# Patient Record
Sex: Male | Born: 1990 | Race: White | Hispanic: No | Marital: Married | Smoking: Never smoker
Health system: Southern US, Community
[De-identification: ages and names within clinical notes are randomized; demographics above are authoritative.]

## PROBLEM LIST (undated history)

## (undated) DIAGNOSIS — R011 Cardiac murmur, unspecified: Secondary | ICD-10-CM

## (undated) DIAGNOSIS — J45909 Unspecified asthma, uncomplicated: Secondary | ICD-10-CM

## (undated) DIAGNOSIS — K219 Gastro-esophageal reflux disease without esophagitis: Secondary | ICD-10-CM

## (undated) HISTORY — DX: Cardiac murmur, unspecified: R01.1

## (undated) HISTORY — PX: TONSILLECTOMY AND ADENOIDECTOMY: SUR1326

## (undated) HISTORY — PX: MOUTH SURGERY: SHX715

## (undated) HISTORY — DX: Unspecified asthma, uncomplicated: J45.909

## (undated) HISTORY — DX: Gastro-esophageal reflux disease without esophagitis: K21.9

---

## 2017-04-16 ENCOUNTER — Emergency Department (HOSPITAL_COMMUNITY)
Admission: EM | Admit: 2017-04-16 | Discharge: 2017-04-17 | Disposition: A | Discharge status: Home / Self Care | Payer: 59 | Attending: Emergency Medicine | Admitting: Emergency Medicine

## 2017-04-16 ENCOUNTER — Encounter (HOSPITAL_COMMUNITY): Payer: Self-pay

## 2017-04-16 DIAGNOSIS — R1084 Generalized abdominal pain: Secondary | ICD-10-CM | POA: Diagnosis not present

## 2017-04-16 LAB — URINALYSIS, ROUTINE W REFLEX MICROSCOPIC
BILIRUBIN URINE: NEGATIVE
Glucose, UA: NEGATIVE mg/dL
Hgb urine dipstick: NEGATIVE
Ketones, ur: NEGATIVE mg/dL
Leukocytes, UA: NEGATIVE
NITRITE: NEGATIVE
PH: 7 (ref 5.0–8.0)
Protein, ur: NEGATIVE mg/dL
SPECIFIC GRAVITY, URINE: 1.01 (ref 1.005–1.030)

## 2017-04-16 LAB — COMPREHENSIVE METABOLIC PANEL
ALBUMIN: 4.5 g/dL (ref 3.5–5.0)
ALT: 70 U/L — ABNORMAL HIGH (ref 17–63)
ANION GAP: 14 (ref 5–15)
AST: 52 U/L — ABNORMAL HIGH (ref 15–41)
Alkaline Phosphatase: 67 U/L (ref 38–126)
BUN: 10 mg/dL (ref 6–20)
CHLORIDE: 99 mmol/L — AB (ref 101–111)
CO2: 23 mmol/L (ref 22–32)
Calcium: 9.6 mg/dL (ref 8.9–10.3)
Creatinine, Ser: 1.11 mg/dL (ref 0.61–1.24)
GFR calc Af Amer: >60 mL/min (ref >60)
GFR calc non Af Amer: >60 mL/min (ref >60)
GLUCOSE: 96 mg/dL (ref 65–99)
POTASSIUM: 4.4 mmol/L (ref 3.5–5.1)
SODIUM: 136 mmol/L (ref 135–145)
TOTAL PROTEIN: 7.7 g/dL (ref 6.5–8.1)
Total Bilirubin: 0.8 mg/dL (ref 0.3–1.2)

## 2017-04-16 LAB — CBC
HEMATOCRIT: 51.4 % (ref 39.0–52.0)
HEMOGLOBIN: 17.7 g/dL — AB (ref 13.0–17.0)
MCH: 31.6 pg (ref 26.0–34.0)
MCHC: 34.4 g/dL (ref 30.0–36.0)
MCV: 91.6 fL (ref 78.0–100.0)
Platelets: 199 10*3/uL (ref 150–400)
RBC: 5.61 MIL/uL (ref 4.22–5.81)
RDW: 12.6 % (ref 11.5–15.5)
WBC: 7.5 10*3/uL (ref 4.0–10.5)

## 2017-04-16 LAB — LIPASE, BLOOD: LIPASE: 33 U/L (ref 11–51)

## 2017-04-16 NOTE — ED Triage Notes (Signed)
Pt states that since Friday he has had lower abd pain, pt also reports constipation and [facility]. Denies dysuria.

## 2017-04-17 ENCOUNTER — Emergency Department (HOSPITAL_COMMUNITY): Payer: 59

## 2017-04-17 NOTE — ED Notes (Signed)
Patient transported to X-ray 

## 2017-04-17 NOTE — Discharge Instructions (Signed)
Your tests are all negative indicating that there is no acute process. You can be discharged home and should follow up with a primary care doctor of your choice (resources provided) if pain continues. Return to the emergency department with any new or concerning symptoms.

## 2017-04-18 NOTE — ED Provider Notes (Signed)
[facility] EMERGENCY DEPARTMENT Provider Note   CSN: [id number] Arrival date & time: 04/16/17  1844     History   Chief Complaint Chief Complaint  Patient presents with  . Abdominal Pain    HPI Derrick Mejia. is a 26 y.o. male.  Patient presents with 5 days of lower abdominal pain that has been intermittent. He had constipation recently, relieved with use of miralax, but same pain continues. It is associated with low back pain. No fever, nausea, vomiting. Better with BM, flatulence. Better with lying down. No aggravating factors. No melena or bloody stools. Reports increased urination but has been drinking more water.  No testicular pain or groin pain. No penile discharge. He states he is worried that he is having these symptoms and is scared of abdominal pain because he just lost his 3-day old son to volvulus.     Abdominal Pain   Pertinent negatives include fever.    History reviewed. No pertinent past medical history.  There are no active problems to display for this patient.   Past Surgical History:  Procedure Laterality Date  . MOUTH SURGERY         Home Medications    Prior to Admission medications   Medication Sig Start Date End Date Taking? Authorizing Provider  acetaminophen (TYLENOL) 325 MG tablet Take 325-650 mg by mouth every 6 (six) hours as needed (for pain or headaches).   Yes [provider]  multivitamin (ONE-A-DAY MEN'S) TABS tablet Take 1 tablet by mouth daily.   Yes [provider]  polyethylene glycol powder (GLYCOLAX/MIRALAX) powder Take 17-25.5 g by mouth daily.    Yes [provider]    Family History No family history on file.  Social History Social History   Tobacco Use  . Smoking status: Never Smoker  . Smokeless tobacco: Never Used  Substance Use Topics  . Alcohol use: Yes  . Drug use: No     Allergies   Sulfa antibiotics; Codone [hydrocodone]; and Percocet  [oxycodone-acetaminophen]   Review of Systems Review of Systems  Constitutional: Negative for chills and fever.  HENT: Negative.   Respiratory: Negative.   Cardiovascular: Negative.   Gastrointestinal: Positive for abdominal pain.  Genitourinary: Negative.   Musculoskeletal: Negative.   Skin: Negative.   Neurological: Negative.      Physical Exam Updated Vital Signs BP 137/79   Pulse 99   Temp 98.1 F (36.7 C) (Oral)   Resp 19   SpO2 100%   Physical Exam  Constitutional: He appears well-developed and well-nourished.  HENT:  Head: Normocephalic.  Neck: Normal range of motion. Neck supple.  Cardiovascular: Normal rate and regular rhythm.  Pulmonary/Chest: Effort normal and breath sounds normal.  Abdominal: Soft. Bowel sounds are normal. There is no tenderness. There is no rebound and no guarding.  Musculoskeletal: Normal range of motion.  Neurological: He is alert. No cranial nerve deficit.  Skin: Skin is warm and dry. No rash noted.  Psychiatric: He has a normal mood and affect.     ED Treatments / Results  Labs (all labs ordered are listed, but only abnormal results are displayed) Labs Reviewed  COMPREHENSIVE METABOLIC PANEL - Abnormal; Notable for the following components:      Result Value   Chloride 99 (*)    AST 52 (*)    ALT 70 (*)    All other components within normal limits  CBC - Abnormal; Notable for the following components:   Hemoglobin 17.7 (*)  All other components within normal limits  LIPASE, BLOOD  URINALYSIS, ROUTINE W REFLEX MICROSCOPIC   Results for orders placed or performed during the hospital encounter of 04/16/17  Lipase, blood  Result Value Ref Range   Lipase 33 11 - 51 U/L  Comprehensive metabolic panel  Result Value Ref Range   Sodium 136 135 - 145 mmol/L   Potassium 4.4 3.5 - 5.1 mmol/L   Chloride 99 (L) 101 - 111 mmol/L   CO2 23 22 - 32 mmol/L   Glucose, Bld 96 65 - 99 mg/dL   BUN 10 6 - 20 mg/dL   Creatinine, Ser 4.091.11  0.61 - 1.24 mg/dL   Calcium 9.6 8.9 - 81.110.3 mg/dL   Total Protein 7.7 6.5 - 8.1 g/dL   Albumin 4.5 3.5 - 5.0 g/dL   AST 52 (H) 15 - 41 U/L   ALT 70 (H) 17 - 63 U/L   Alkaline Phosphatase 67 38 - 126 U/L   Total Bilirubin 0.8 0.3 - 1.2 mg/dL   GFR calc non Af Amer >60 >60 mL/min   GFR calc Af Amer >60 >60 mL/min   Anion gap 14 5 - 15  CBC  Result Value Ref Range   WBC 7.5 4.0 - 10.5 K/uL   RBC 5.61 4.22 - 5.81 MIL/uL   Hemoglobin 17.7 (H) 13.0 - 17.0 g/dL   HCT 91.451.4 78.239.0 - 95.652.0 %   MCV 91.6 78.0 - 100.0 fL   MCH 31.6 26.0 - 34.0 pg   MCHC 34.4 30.0 - 36.0 g/dL   RDW 21.312.6 08.611.5 - 57.815.5 %   Platelets 199 150 - 400 K/uL  Urinalysis, Routine w reflex microscopic  Result Value Ref Range   Color, Urine YELLOW YELLOW   APPearance CLEAR CLEAR   Specific Gravity, Urine 1.010 1.005 - 1.030   pH 7.0 5.0 - 8.0   Glucose, UA NEGATIVE NEGATIVE mg/dL   Hgb urine dipstick NEGATIVE NEGATIVE   Bilirubin Urine NEGATIVE NEGATIVE   Ketones, ur NEGATIVE NEGATIVE mg/dL   Protein, ur NEGATIVE NEGATIVE mg/dL   Nitrite NEGATIVE NEGATIVE   Leukocytes, UA NEGATIVE NEGATIVE    EKG  EKG Interpretation None       Radiology Dg Abdomen Acute W/chest  Result Date: 04/17/2017 CLINICAL DATA:  [age] year old male with abdominal pain. EXAM: DG ABDOMEN ACUTE W/ 1V CHEST COMPARISON:  Lumbar spine radiograph dated 07/31/2012 FINDINGS: The lungs are clear. There is no pleural effusion or pneumothorax. The cardiac silhouette is within normal limits. There is no bowel dilatation or evidence of obstruction. No free air or radiopaque calculi. The osseous structures and soft tissues appear unremarkable. IMPRESSION: Negative abdominal radiographs.  No acute cardiopulmonary disease. Electronically Signed   By: Elgie CollardArash  Radparvar M.D.   On: 04/17/2017 00:56    Procedures Procedures (including critical care time)  Medications Ordered in ED Medications - No data to display   Initial Impression / Assessment and Plan / ED  Course  I have reviewed the triage vital signs and the nursing notes.  Pertinent labs & imaging results that were available during my care of the patient were reviewed by me and considered in my medical decision making (see chart for details).     Patient with abdominal pain, without other symptoms. Exam is essentially benign and lab studies are normal. No cause found for his abdominal pain but he can be discharged home with PCP follow up.  Final Clinical Impressions(s) / ED Diagnoses   Final diagnoses:  Generalized abdominal  pain    ED Discharge Orders    None       Elpidio AnisUpstill, Kindred Reidinger, Cordelia Poche-C 04/18/17 0503    Arby BarrettePfeiffer, Marcy, MD 04/24/17 2322

## 2017-06-16 ENCOUNTER — Encounter: Payer: Self-pay | Admitting: Family Medicine

## 2017-06-16 ENCOUNTER — Ambulatory Visit (INDEPENDENT_AMBULATORY_CARE_PROVIDER_SITE_OTHER): Payer: 59 | Admitting: Family Medicine

## 2017-06-16 VITALS — BP 118/88 | HR 98 | Temp 98.4°F | Ht 67.5 in | Wt 175.6 lb

## 2017-06-16 DIAGNOSIS — Z634 Disappearance and death of family member: Secondary | ICD-10-CM | POA: Diagnosis not present

## 2017-06-16 DIAGNOSIS — M545 Low back pain: Secondary | ICD-10-CM

## 2017-06-16 DIAGNOSIS — G8929 Other chronic pain: Secondary | ICD-10-CM | POA: Diagnosis not present

## 2017-06-16 DIAGNOSIS — R1031 Right lower quadrant pain: Secondary | ICD-10-CM

## 2017-06-16 DIAGNOSIS — F4321 Adjustment disorder with depressed mood: Secondary | ICD-10-CM

## 2017-06-16 DIAGNOSIS — K219 Gastro-esophageal reflux disease without esophagitis: Secondary | ICD-10-CM | POA: Insufficient documentation

## 2017-06-16 DIAGNOSIS — J45909 Unspecified asthma, uncomplicated: Secondary | ICD-10-CM | POA: Insufficient documentation

## 2017-06-16 DIAGNOSIS — R7989 Other specified abnormal findings of blood chemistry: Secondary | ICD-10-CM | POA: Insufficient documentation

## 2017-06-16 DIAGNOSIS — R945 Abnormal results of liver function studies: Secondary | ICD-10-CM

## 2017-06-16 NOTE — Assessment & Plan Note (Signed)
S:Long term low back pain . 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 Perhaps mildly worse lately but states he once again gets hyperfocused on issues.  A/P: some DDD on prior x-rays noted in PACS system. No red flags on exam today outside of duration. Offered referral to Dr. Berline Choughigby- declines for now

## 2017-06-16 NOTE — Assessment & Plan Note (Signed)
Alcohol 3-4 a night. LFTs under 100.  Lab Results  Component Value Date   ALT 70 (H) 04/16/2017   AST 52 (H) 04/16/2017   ALKPHOS 67 04/16/2017   BILITOT 0.8 04/16/2017  advised cutting down to 1-2 a night maximum- follow up in 3 months and repeat LFTs- he declines repeat today

## 2017-06-16 NOTE — Assessment & Plan Note (Signed)
S: lost 3 day old child late 2018- [facility]. Patient states initially thought he was ok- was trying to be strong for wife. In last month or so wife has been better but he states he constantly thinks something is seriously wrong with him but admits he thinks this is stress related A/P: prolonged counseling with patient. We discussed hospice grief counseling as a great choice- they even have groups for folks who have lost a child. He agrees to consider. Also gave handout for  behavioral health

## 2017-06-16 NOTE — Progress Notes (Signed)
Phone: [phone]  Subjective:  Patient presents today to establish care as a new patient with no recent medical provider. Chief complaint-noted.   See problem oriented charting  The following were reviewed and entered/updated in epic: Past Medical History:  Diagnosis Date  . Asthma    childhood- declines need for albuterol as adult  . GERD (gastroesophageal reflux disease)    intermittent prn OTC  . Heart murmur    in childhood only   Patient Active Problem List   Diagnosis Date Noted  . Grief at loss of child 06/16/2017    Priority: High  . Chronic bilateral low back pain 06/16/2017    Priority: Low  . Asthma     Priority: Low  . GERD (gastroesophageal reflux disease)     Priority: Low  . LFT elevation 06/16/2017   Past Surgical History:  Procedure Laterality Date  . MOUTH SURGERY    . TONSILLECTOMY AND ADENOIDECTOMY Bilateral     Family History  Problem Relation Age of Onset  . Asthma Mother   . Hypertension Mother   . Arthritis Mother   . Kidney Stones Father        but NO regular care  . Diverticulitis Brother        half  . Early death Son   . Diverticulitis Maternal Grandmother   . Klinefelter's syndrome Brother        half  . Other Brother        half    Medications- reviewed and updated No current outpatient medications on file.   No current facility-administered medications for this visit.     Allergies-reviewed and updated Allergies  Allergen Reactions  . Sulfa Antibiotics Anaphylaxis  . Codone [Hydrocodone] Rash    Mouth breaks out in sores (if taken)  . Percocet [Oxycodone-Acetaminophen] Rash    Mouth will break out in sores (if taken)    Social History   Social History Narrative   Married in Apr 11, 2015. 1st son died at 30 days old within 3 days of their anniversary.       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  HENT: Negative for hearing loss and tinnitus.   Eyes: Negative for blurred vision and double vision.  Respiratory: Negative for cough and hemoptysis.   Cardiovascular: Negative for chest pain and palpitations.  Gastrointestinal: Positive for abdominal pain (lower) and heartburn. Negative for constipation, diarrhea, nausea and vomiting.  Genitourinary: Positive for frequency. Negative for hematuria.  Musculoskeletal: Positive for back pain and myalgias. Negative for neck pain.  Skin: Negative for itching and rash.  Neurological: Negative for dizziness and headaches.  Endo/Heme/Allergies: Negative for polydipsia. Does not bruise/bleed easily.  Psychiatric/Behavioral: Negative for hallucinations. The patient is nervous/anxious.    Objective: BP 118/88   Pulse 98   Temp 98.4 F (36.9 C) (Oral)   Ht 5' 7.5" (1.715 m)   Wt 175 lb 9.6 oz (79.7 kg)   SpO2 97%   BMI 27.10 kg/m  Gen: NAD, resting comfortably HEENT: Mucous membranes are moist. Oropharynx normal. TM normal. Eyes: sclera and lids normal, PERRLA Neck: no thyromegaly, no cervical lymphadenopathy CV: RRR no murmurs rubs or gallops Lungs: CTAB no crackles, wheeze, rhonchi Abdomen: soft/nontender/nondistended/normal bowel sounds. No rebound or guarding.  Ext: no edema  Skin: warm, dry Neuro: 5/5 strength in upper and lower extremities, normal gait, normal reflexes GU: no obvious hernia (initially thought possibly mild bulge in right groin but later noted to be equal bilaterally), circumcised penis, normal testicular exam and scrotum exam. No lymph node enlarement  Assessment/Plan:  Right groin discomfort S: Patient states since last Wednesday he has had some right groin discomfort. He thought he may have felt lymph node enlarged and is now worried about lymphoma. Also worried about hernia. States he has gone back to work recently and doing much heavier  lifting and also tried some planks recently. Patient has pressed on area a lot and wonders if that is causing his tenderness.   He has also had off and on abdominal pain lower for 2-3 months. Shifts focus each time new symptom comes up. Had UA in hospital reassuring. CBC and CMP also largely reassuring (emergency room visit for abdominal pain) as well as abdominal films. Stated this started after a dose of ex lax.   Stress level high with loss of child late [id number] at 513 days old A/P: Normal testicular and groin exam- no obvious hernia or lymph node enlargement. Also with some lower abdominal pain- largely mild- discussed follow up for this if new or worsening symptoms  Grief at loss of child S: lost 3 day old child late 2018- [facility]. Patient states initially thought he was ok- was trying to be strong for wife. In last month or so wife has been better but he states he constantly thinks something is seriously wrong with him but admits he thinks this is stress related A/P: prolonged counseling with patient. We discussed hospice grief counseling as a great choice- they even have groups for folks who have lost a child. He agrees to consider. Also gave handout for [place] [facility]   Chronic bilateral low back pain S:Long term low back pain . 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 Perhaps mildly worse lately but states he once again gets hyperfocused on issues.  A/P: some DDD on prior x-rays noted in PACS system. No red flags on exam today outside of duration. Offered referral to Dr. Berline Choughigby- declines for now  LFT elevation Alcohol 3-4 a night. LFTs under 100.  Lab Results  Component Value Date   ALT 70 (H) 04/16/2017   AST 52 (H) 04/16/2017   ALKPHOS 67 04/16/2017   BILITOT 0.8 04/16/2017  advised cutting down to 1-2 a night maximum- follow up in 3 months and repeat LFTs- he declines repeat today  Future Appointments  Date Time Provider Department Center   09/15/2017 11:00 AM Shelva MajesticHunter, Stephen O, MD [facility]   Please note- patient was NOT given flu shot or prevnar 13- these will be removed from patient chart  Return precautions advised. Tana ConchStephen Hunter, MD

## 2017-06-16 NOTE — Patient Instructions (Signed)
Exam is very reassuring today- no enlarged lymph nodes  I honestly think you may have a muscle strain either from the increased lifting or the planking  Dr. Berline Choughigby would be happy to see you for your low back pain if you would like - he is skilled with manipulation if appropriate- I can refer you if you want- just let us know  See me in 3 months for a physical- come fasting and we will update labs  Would cut alcohol to 10-14 a week maximum with very slight increase in #s found on liver tests  Please consider the hospice grief counseling- I agree that the stress from what you have had to go through is likely prompting higher focus on your physical symptoms

## 2017-07-07 ENCOUNTER — Telehealth: Payer: Self-pay | Admitting: Family Medicine

## 2017-07-07 NOTE — Telephone Encounter (Signed)
See note

## 2017-07-07 NOTE — Telephone Encounter (Signed)
Copied from CRM [phone]. Topic: Inquiry >> Jul 07, 2017  4:08 PM Stephannie LiSimmons, Tationna Fullard L, NT wrote: Reason for CRM: Patient called and would like a return call from Dr Durene CalHunter he was having stomach issues after using the bathroom he has something similar to cramps .

## 2017-07-07 NOTE — Telephone Encounter (Signed)
Patient is scheduled for tomorrow, July 08, 2017 at 4:15

## 2017-07-08 ENCOUNTER — Encounter: Payer: Self-pay | Admitting: Family Medicine

## 2017-07-08 ENCOUNTER — Ambulatory Visit (INDEPENDENT_AMBULATORY_CARE_PROVIDER_SITE_OTHER): Payer: 59 | Admitting: Family Medicine

## 2017-07-08 VITALS — BP 134/88 | HR 101 | Temp 98.9°F | Ht 67.5 in | Wt 176.8 lb

## 2017-07-08 DIAGNOSIS — R1030 Lower abdominal pain, unspecified: Secondary | ICD-10-CM

## 2017-07-08 LAB — POC HEMOCCULT BLD/STL (OFFICE/1-CARD/DIAGNOSTIC): FECAL OCCULT BLD: NEGATIVE

## 2017-07-08 NOTE — Progress Notes (Signed)
Subjective:  Derrick MinionRichard J Oettinger Jr. is a 27 y.o. year old very pleasant male patient who presents for/with See problem oriented charting ROS- no nausea or vomiting. No unintentional weight loss. Still admits to high stress level.    Past Medical History-  Patient Active Problem List   Diagnosis Date Noted  . Grief at loss of child 06/16/2017    Priority: High  . Chronic bilateral low back pain 06/16/2017    Priority: Low  . Asthma     Priority: Low  . GERD (gastroesophageal reflux disease)     Priority: Low  . LFT elevation 06/16/2017    Medications- reviewed and updated No current outpatient medications on file.   No current facility-administered medications for this visit.     Objective: BP 134/88 (BP Location: Left Arm, Patient Position: Sitting, Cuff Size: Large)   Pulse (!) 101   Temp 98.9 F (37.2 C) (Oral)   Ht 5' 7.5" (1.715 m)   Wt 176 lb 12.8 oz (80.2 kg)   SpO2 96%   BMI 27.28 kg/m  Gen: NAD, resting comfortably CV: RRR no murmurs rubs or gallops. Not tachycardic on my exam Lungs: CTAB no crackles, wheeze, rhonchi Abdomen: soft/nontender on exam/nondistended/normal bowel sounds. No rebound or guarding.  Ext: no edema Skin: warm, dry No lymphadenopathy in the groin. Rectal exam normal without large stool burden.   Assessment/Plan:  Lower abdominal pain - Plan: POC Hemoccult Bld/Stl (1-Cd Office Dx), CANCELED: POCT Occult Blood Stool S: after he has a bowel movement- feels like stomach cramps and some low back pain. Per week, has 3-4 x where it hurts out of the 7 BMs per week. States extremely mild- even hesitates to call current symptoms pain  He started on nexium and has had less bloating and slight decrease in discomfort after bowel movement. Was having epigastric pain and that is much better. Sometimes he has some tailbone pain if sits prolonged period.   Admits main concern is colon cancer. If he has an alcoholic beverage or is busy- doesn't really notice  any of the above symptoms.  A/P: Patient remains highly anxious about his health and potential for high risk conditions like cancer. Stool card today negative for blood and this gave him a fair bit of reassurance. We still did give follow up precautions. I once again discussed calling hospice for grief counseling or getting 1 on 1 therapy through [place] [facility].    Future Appointments  Date Time Provider Department Center  09/15/2017 11:00 AM Shelva MajesticHunter, Filbert Craze O, MD [facility]   Lab/Order associations: Lower abdominal pain - Plan: POC Hemoccult Bld/Stl (1-Cd Office Dx), CANCELED: POCT Occult Blood Stool  Time Stamp The duration of face-to-face time during this visit was greater than 15 minutes. Greater than 50% of this time was spent in counseling, explanation of diagnosis, planning of further management, and/or coordination of care including particularly discussing importance of counseling and also providing reassurance as to low risk for colon cancer. .    Return precautions advised.  Tana ConchStephen Clayten Allcock, MD

## 2017-07-08 NOTE — Patient Instructions (Signed)
Negative stool card today- that is a negative screening for prostate cancer  I still think most of your symptoms are from anxiety- continue to encourage you to seek counseling

## 2017-08-13 ENCOUNTER — Encounter: Payer: Self-pay | Admitting: Family Medicine

## 2017-08-15 ENCOUNTER — Encounter: Payer: Self-pay | Admitting: Family Medicine

## 2017-08-15 ENCOUNTER — Telehealth: Payer: Self-pay

## 2017-08-15 ENCOUNTER — Ambulatory Visit (INDEPENDENT_AMBULATORY_CARE_PROVIDER_SITE_OTHER): Payer: 59 | Admitting: Family Medicine

## 2017-08-15 VITALS — BP 118/88 | HR 99 | Temp 98.4°F | Ht 67.5 in | Wt 171.0 lb

## 2017-08-15 DIAGNOSIS — R1013 Epigastric pain: Secondary | ICD-10-CM | POA: Diagnosis not present

## 2017-08-15 DIAGNOSIS — K219 Gastro-esophageal reflux disease without esophagitis: Secondary | ICD-10-CM | POA: Diagnosis not present

## 2017-08-15 DIAGNOSIS — B370 Candidal stomatitis: Secondary | ICD-10-CM

## 2017-08-15 LAB — COMPREHENSIVE METABOLIC PANEL
ALBUMIN: 4.8 g/dL (ref 3.5–5.2)
ALK PHOS: 66 U/L (ref 39–117)
ALT: 31 U/L (ref 0–53)
AST: 26 U/L (ref 0–37)
BUN: 11 mg/dL (ref 6–23)
CALCIUM: 10.1 mg/dL (ref 8.4–10.5)
CO2: 27 mEq/L (ref 19–32)
CREATININE: 1.1 mg/dL (ref 0.40–1.50)
Chloride: 99 mEq/L (ref 96–112)
GFR: 84.59 mL/min (ref >60.00)
Glucose, Bld: 88 mg/dL (ref 70–99)
POTASSIUM: 4.5 meq/L (ref 3.5–5.1)
Sodium: 135 mEq/L (ref 135–145)
TOTAL PROTEIN: 7.9 g/dL (ref 6.0–8.3)
Total Bilirubin: 1 mg/dL (ref 0.2–1.2)

## 2017-08-15 LAB — LIPASE: LIPASE: 26 U/L (ref 11.0–59.0)

## 2017-08-15 LAB — CBC
HEMATOCRIT: 53.5 % — AB (ref 39.0–52.0)
Hemoglobin: 18.6 g/dL (ref 13.0–17.0)
MCHC: 34.8 g/dL (ref 30.0–36.0)
MCV: 91.6 fl (ref 78.0–100.0)
PLATELETS: 202 10*3/uL (ref 150.0–400.0)
RBC: 5.84 Mil/uL — AB (ref 4.22–5.81)
RDW: 12.9 % (ref 11.5–15.5)
WBC: 5.2 10*3/uL (ref 4.0–10.5)

## 2017-08-15 MED ORDER — NYSTATIN 100000 UNIT/ML MT SUSP: 5.0000 mL | Freq: Four times a day (QID) | OROMUCOSAL | 0 refills | Status: DC

## 2017-08-15 NOTE — Telephone Encounter (Signed)
Spoke with Dr. Durene CalHunter who is aware. Patient's hemoglobin was high last check also.

## 2017-08-15 NOTE — Progress Notes (Signed)
Subjective:  Derrick Mejia. is a 27 y.o. year old very pleasant male patient who presents for/with See problem oriented charting ROS- epigastric burning. No shortness of breath. No edema. andmits to anxiety   Past Medical History-  Patient Active Problem List   Diagnosis Date Noted  . Grief at loss of child 06/16/2017    Priority: High  . Chronic bilateral low back pain 06/16/2017    Priority: Low  . Asthma     Priority: Low  . GERD (gastroesophageal reflux disease)     Priority: Low  . LFT elevation 06/16/2017    Medications- reviewed and updated Current Outpatient Medications  Medication Sig Dispense Refill  . nystatin (MYCOSTATIN) 100000 UNIT/ML suspension Take 5 mLs (500,000 Units total) by mouth 4 (four) times daily. For 7 days. Swish and spit out after 1 minute. 180 mL 0   No current facility-administered medications for this visit.     Objective: BP 118/88 (BP Location: Left Arm, Patient Position: Sitting, Cuff Size: Large)   Pulse 99   Temp 98.4 F (36.9 C) (Oral)   Ht 5' 7.5" (1.715 m)   Wt 171 lb (77.6 kg)   SpO2 98%   BMI 26.39 kg/m  Gen: NAD, resting comfortably CV: RRR no murmurs rubs or gallops Lungs: CTAB no crackles, wheeze, rhonchi Abdomen: soft/nontender/nondistended/normal bowel sounds. No rebound or guarding.  Ext: no edema Skin: warm, dry  Assessment/Plan:  Also on tongue note whitish plaque- will treat with nystatin for 7 days for potential thrush  GERD (gastroesophageal reflux disease) Upper abdominal pain S:  He was seen in late February for very mild lower abdominal pain after bowel movements at least for 50% of bowel movements. Also was having some bloating and epigastric discomfort. Nexium seemed to help all symptoms.   He has had a phoia of medical issues since loss of his infant- which is understandable. We did stool cards to provide some reassurance against colon cancer which was his main concern. We discussed hospice for grief  counseling or [place] [facility] for 1 on 1 therapy. He does not mention the lower abdominal pain today. 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 Went to [employer] last Friday and took ranitidine which helped a fair amount for a few days. He went to [place] and didn't take ranitidine and had significant reflux- then later took one which helped some. Then later did some baking soda which helped some. Yesterday had similar sensation but not as severe as Wednesday. Mylanta Wednesday night didn't help. Some mid back pain as well. Stool has alternated between brown and light yellow. No unintentional weight loss- states has lost from weight from worrying- states barely ate last 2 days anxious about pancreatic cancer. Burping more and some regurgitation.  A/P: very strongly suspect GERD - treat with ranitidine twice a day before meals for next 7 days - if that doesn't work use omeprazole 20mg  over the counter before breakfast for 2 weeks - he will see me back if that doesn't work - will get CBC, CMP, lipase. He is still drinking 3-4 beers a night at times- advised against with his mild LFT elevations. Will trend that today. Also appears mildly dehydrated- may be reason for high hemoglobin. Lipase to reassure him not pancreatic cancer  Future Appointments  Date Time Provider Department Center  09/15/2017 11:00 AM Shelva Majestic, MD [facility]   Lab/Order associations: Epigastric abdominal pain - Plan: CBC, Comprehensive metabolic panel, Lipase  Gastroesophageal reflux disease, esophagitis presence not specified  Meds ordered this encounter  Medications  . nystatin (MYCOSTATIN) 100000 UNIT/ML suspension    Sig: Take 5 mLs (500,000 Units total) by mouth 4 (four) times daily. For 7 days. Swish and spit out after 1 minute.    Dispense:  180 mL    Refill:  0     Return precautions advised.  Tana Conch, MD

## 2017-08-15 NOTE — Assessment & Plan Note (Signed)
Upper abdominal pain S:  He was seen in late February for very mild lower abdominal pain after bowel movements at least for 50% of bowel movements. Also was having some bloating and epigastric discomfort. Nexium seemed to help all symptoms.   He has had a phoia of medical issues since loss of his infant- which is understandable. We did stool cards to provide some reassurance against colon cancer which was his main concern. We discussed hospice for grief counseling or [facility] for 1 on 1 therapy. He does not mention the lower abdominal pain today. 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 Went to [employer] last Friday and took ranitidine which helped a fair amount for a few days. He went to [place] and didn't take ranitidine and had significant reflux- then later took one which helped some. Then later did some baking soda which helped some. Yesterday had similar sensation but not as severe as Wednesday. Mylanta Wednesday night didn't help. Some mid back pain as well. Stool has alternated between brown and light yellow. No unintentional weight loss- states has lost from weight from worrying- states barely ate last 2 days anxious about pancreatic cancer. Burping more and some regurgitation.  A/P: very strongly suspect GERD - treat with ranitidine twice a day before meals for next 7 days - if that doesn't work use omeprazole 20mg  over the counter before breakfast for 2 weeks - he will see me back if that doesn't work - will get CBC, CMP, lipase. He is still drinking 3-4 beers a night at times- advised against with his mild LFT elevations. Will trend that today. Also appears mildly dehydrated- may be reason for high hemoglobin. Lipase to reassure him not pancreatic cancer

## 2017-08-15 NOTE — Patient Instructions (Addendum)
very strongly suspect acid reflux as cause of symptoms - treat with ranitidine twice a day before meals for next 7 days - if that doesn't work use omeprazole 20mg  over the counter before breakfast for 2 weeks - he will see me back if that doesn't work  Also on tongue note whitish discoloration- will treat with nystatin for 7 days for potential thrush  Try to drink at least 60 oz of water a day  Please stop by lab before you go  Looks like we have follow up in a month anyway- so that will be a good time to check in

## 2017-08-15 NOTE — Telephone Encounter (Signed)
[facility] called with critical Hgb on patient. Hgb 18.6.

## 2017-08-16 NOTE — Telephone Encounter (Signed)
See result note.  

## 2017-08-18 ENCOUNTER — Telehealth: Payer: Self-pay

## 2017-08-18 ENCOUNTER — Telehealth: Payer: Self-pay | Admitting: Family Medicine

## 2017-08-18 NOTE — Telephone Encounter (Unsigned)
Copied from CRM [phone]. Topic: Inquiry >> Aug 18, 2017  9:54 AM Raquel SarnaHayes, Teresa G wrote: Pt has been asked to make an appt from labs recently done. Pt wants a call back to to discuss why he is being asked for an appt.  Please call pt back to discuss and schedule appt.

## 2017-08-18 NOTE — Telephone Encounter (Signed)
Called and spoke to patient about his labs result. Patient verbalized understanding. I schedule him a follow up appointment for April 15th to discuss blood work

## 2017-08-18 NOTE — Telephone Encounter (Signed)
Patterson HammersmithAria Dark, CMA called and spoke with patient

## 2017-08-25 ENCOUNTER — Ambulatory Visit (INDEPENDENT_AMBULATORY_CARE_PROVIDER_SITE_OTHER): Payer: 59 | Admitting: Family Medicine

## 2017-08-25 ENCOUNTER — Encounter: Payer: Self-pay | Admitting: Family Medicine

## 2017-08-25 VITALS — BP 122/68 | HR 80 | Temp 98.2°F | Ht 67.5 in | Wt 171.6 lb

## 2017-08-25 DIAGNOSIS — Z114 Encounter for screening for human immunodeficiency virus [HIV]: Secondary | ICD-10-CM | POA: Diagnosis not present

## 2017-08-25 DIAGNOSIS — D751 Secondary polycythemia: Secondary | ICD-10-CM | POA: Insufficient documentation

## 2017-08-25 NOTE — Patient Instructions (Addendum)
Health Maintenance Due  Topic Date Due  . HIV Screening- Today in Office Visit. Obviously very low risk 05/30/2004   Please stop by lab before you go  Some of these labs MAY take longer to get back than usual (such as a few weeks)

## 2017-08-25 NOTE — Progress Notes (Signed)
Subjective:  Derrick MinionRichard J Steveson Jr. is a 27 y.o. year old very pleasant male patient who presents for/with See problem oriented charting ROS- abdominal pain resolved. Rare headaches with no recent increase. No fever, chills, night sweats.    Past Medical History-  Patient Active Problem List   Diagnosis Date Noted  . Grief at loss of child 06/16/2017    Priority: High  . Chronic bilateral low back pain 06/16/2017    Priority: Low  . Asthma     Priority: Low  . GERD (gastroesophageal reflux disease)     Priority: Low  . Polycythemia 08/25/2017  . LFT elevation 06/16/2017    Medications- reviewed and updated Current Outpatient Medications  Medication Sig Dispense Refill  . nystatin (MYCOSTATIN) 100000 UNIT/ML suspension Take 5 mLs (500,000 Units total) by mouth 4 (four) times daily. For 7 days. Swish and spit out after 1 minute. 180 mL 0   No current facility-administered medications for this visit.     Objective: BP 122/68 (BP Location: Left Arm, Patient Position: Sitting, Cuff Size: Normal)   Pulse 80   Temp 98.2 F (36.8 C) (Oral)   Ht 5' 7.5" (1.715 m)   Wt 171 lb 9.6 oz (77.8 kg)   SpO2 99%   BMI 26.48 kg/m  Gen: NAD, resting comfortably CV: RRR no murmurs rubs or gallops Lungs: CTAB no crackles, wheeze, rhonchi Abdomen: soft/nontender/nondistended/normal bowel sounds. No hepatosplenomegaly Ext: no edema Skin: warm, dry  Assessment/Plan:  Polycythemia S:  Patient presents for follow up of polycythemia. He has found out from family that he has thick blood all his life. Mom thought he had leukemia when he was a child and discussed thoroughly with pediatrician. Mom and grandma had thick blood as well.   Did have abdominal pain last viist but that resolved with use of zantac 1-2x a day.he states this has been best he has felt other than anxiety about the polycythemia/thick blood. Denies depressed mood but has had anxiety thinking about the polythemia as well  No  cardiopulmonary diasease other than ashtma as a kid. Told heart murmur as kid but has resolved. Snores some but no daytime sleepiness- doubt OSA. Cigars celebration only- very rarely and otherwise never a smoker. Most significant medical event in his life is his report of 60-80% blood loss after tonsils removed when 6-7- apparently cautery wasn't performed after surgery as planned. No hepatosplenomegaly on exam.   No fever, sweats, significant weight loss (has some variation). No itciness after bath/shower. UA normal in December  sats 99% after exercise (2 minutes of jumping jacks).  A/P:With family history and him being told he has "thick blood" his entire life- doubt there is significant underlying illness here. We will get pathologist smear review, serum erythropoietin, jak2 mutation to evaluate for polycythemica vera . Considering hematology for their opinion in young patient with polycythemia with no obvious cause if bloodwork above normal- though suspect possible genetic non pathological variant   Future Appointments  Date Time Provider Department Center  09/15/2017 11:00 AM Shelva MajesticHunter, Jonda Alanis O, MD [facility]   Lab/Order associations: Polycythemia - Plan: JAK2 V617F, w Reflex to CALR/E12/MPL, Erythropoietin, Pathologist smear review  Encounter for screening for HIV - Plan: HIV antibody  Return precautions advised.  Tana ConchStephen Branston Halsted, MD

## 2017-08-25 NOTE — Assessment & Plan Note (Addendum)
S:  Patient presents for follow up of polycythemia. He has found out from family that he has thick blood all his life. Mom thought he had leukemia when he was a child and discussed thoroughly with pediatrician. Mom and grandma had thick blood as well.   Did have abdominal pain last viist but that resolved with use of zantac 1-2x a day.he states this has been best he has felt other than anxiety about the polycythemia/thick blood. Denies depressed mood but has had anxiety thinking about the polythemia as well  No cardiopulmonary diasease other than ashtma as a kid. Told heart murmur as kid but has resolved. Snores some but no daytime sleepiness- doubt OSA. Cigars celebration only- very rarely and otherwise never a smoker. Most significant medical event in his life is his report of 60-80% blood loss after tonsils removed when 6-7- apparently cautery wasn't performed after surgery as planned. No hepatosplenomegaly on exam.   No fever, sweats, significant weight loss (has some variation). No itciness after bath/shower. UA normal in December  sats 99% after exercise (2 minutes of jumping jacks).  A/P:With family history and him being told he has "thick blood" his entire life- doubt there is significant underlying illness here. We will get pathologist smear review, serum erythropoietin, jak2 mutation to evaluate for polycythemica vera . Considering hematology for their opinion in young patient with polycythemia with no obvious cause if bloodwork above normal- though suspect possible genetic non pathological variant

## 2017-08-26 LAB — HIV ANTIBODY (ROUTINE TESTING W REFLEX): HIV 1&2 Ab, 4th Generation: NONREACTIVE

## 2017-08-26 NOTE — Progress Notes (Signed)
Good news- HIV negative. The smear review of your blood did not show any significant abnormalities. Both are reassuring tests.

## 2017-08-27 ENCOUNTER — Encounter: Payer: Self-pay | Admitting: Family Medicine

## 2017-08-27 LAB — ERYTHROPOIETIN: Erythropoietin: 1.5 m[IU]/mL — ABNORMAL LOW (ref 2.6–18.5)

## 2017-08-27 LAB — PATHOLOGIST SMEAR REVIEW

## 2017-08-28 NOTE — Telephone Encounter (Signed)
See note.   Copied from CRM [phone]. Topic: Inquiry >> Aug 28, 2017 10:12 AM Windy KalataMichael, Taylor L, NT wrote: Reason for CRM: patient states that him and Dr. Durene CalHunter was messaging on mychart about his lab results. He states that he had low iron when he was a kid as well. He would like Dr. Durene CalHunter to call him so that he can explain everything. Please advise.

## 2017-09-03 ENCOUNTER — Other Ambulatory Visit: Payer: Self-pay

## 2017-09-03 DIAGNOSIS — D751 Secondary polycythemia: Secondary | ICD-10-CM

## 2017-09-03 LAB — CALR+JAK2 E12-15+MPL

## 2017-09-03 LAB — JAK2 V617F, W REFLEX TO CALR/E12/MPL

## 2017-09-08 ENCOUNTER — Inpatient Hospital Stay: Payer: 59

## 2017-09-08 ENCOUNTER — Encounter: Payer: Self-pay | Admitting: Oncology

## 2017-09-08 ENCOUNTER — Other Ambulatory Visit: Payer: Self-pay

## 2017-09-08 ENCOUNTER — Inpatient Hospital Stay: Payer: 59 | Attending: Oncology | Admitting: Oncology

## 2017-09-08 VITALS — BP 127/84 | HR 114 | Temp 98.6°F | Resp 18 | Wt 177.2 lb

## 2017-09-08 DIAGNOSIS — Z87891 Personal history of nicotine dependence: Secondary | ICD-10-CM | POA: Insufficient documentation

## 2017-09-08 DIAGNOSIS — I252 Old myocardial infarction: Secondary | ICD-10-CM | POA: Diagnosis not present

## 2017-09-08 DIAGNOSIS — D751 Secondary polycythemia: Secondary | ICD-10-CM

## 2017-09-08 DIAGNOSIS — Z79899 Other long term (current) drug therapy: Secondary | ICD-10-CM | POA: Diagnosis not present

## 2017-09-08 DIAGNOSIS — Z86718 Personal history of other venous thrombosis and embolism: Secondary | ICD-10-CM | POA: Diagnosis not present

## 2017-09-08 LAB — CBC WITH DIFFERENTIAL/PLATELET
BASOS PCT: 1 %
Basophils Absolute: 0.1 10*3/uL (ref 0–0.1)
EOS ABS: 0.1 10*3/uL (ref 0–0.7)
EOS PCT: 2 %
HCT: 52.7 % — ABNORMAL HIGH (ref 40.0–52.0)
Hemoglobin: 18 g/dL (ref 13.0–18.0)
LYMPHS ABS: 1 10*3/uL (ref 1.0–3.6)
Lymphocytes Relative: 13 %
MCH: 31.4 pg (ref 26.0–34.0)
MCHC: 34.1 g/dL (ref 32.0–36.0)
MCV: 92.2 fL (ref 80.0–100.0)
MONOS PCT: 10 %
Monocytes Absolute: 0.8 10*3/uL (ref 0.2–1.0)
NEUTROS PCT: 74 %
Neutro Abs: 5.8 10*3/uL (ref 1.4–6.5)
PLATELETS: 221 10*3/uL (ref 150–440)
RBC: 5.72 MIL/uL (ref 4.40–5.90)
RDW: 13.6 % (ref 11.5–14.5)
WBC: 7.7 10*3/uL (ref 3.8–10.6)

## 2017-09-08 LAB — URINALYSIS, COMPLETE (UACMP) WITH MICROSCOPIC
BILIRUBIN URINE: NEGATIVE
Bacteria, UA: NONE SEEN
GLUCOSE, UA: NEGATIVE mg/dL
Hgb urine dipstick: NEGATIVE
KETONES UR: NEGATIVE mg/dL
LEUKOCYTES UA: NEGATIVE
Nitrite: NEGATIVE
PH: 6 (ref 5.0–8.0)
PROTEIN: NEGATIVE mg/dL
SQUAMOUS EPITHELIAL / LPF: NONE SEEN (ref 0–5)
Specific Gravity, Urine: 1.015 (ref 1.005–1.030)

## 2017-09-08 LAB — TSH: TSH: 2.79 u[IU]/mL (ref 0.350–4.500)

## 2017-09-08 NOTE — Progress Notes (Signed)
Patient here for new evaluation.  °

## 2017-09-08 NOTE — Progress Notes (Signed)
Hematology/Oncology Consult note [facility] Telephone:([phone] [phone]   Patient Care Team: Marin Olp, MD as PCP - General (Family Medicine)  REFERRING PROVIDER: Marin Olp, MD CHIEF COMPLAINTS/PURPOSE OF CONSULTATION:  Evaluation of polycythemia.   HISTORY OF PRESENTING ILLNESS:  Derrick Vittorio. is a  27 y.o.  male with PMH listed below who was referred to me for evaluation of polycythemia.  Patient reports having elevated hemoglobin as a child and also reports his parents having same issue. Denies any history of stroke, MI, DVT.  Feels well. Denies any night sweat, weight loss, fever or chills. Denies vision problem.     Review of Systems  Constitutional: Negative for chills, fever, malaise/fatigue and weight loss.  HENT: Negative for congestion, ear discharge, ear pain, nosebleeds, sinus pain and sore throat.   Eyes: Negative for double vision, photophobia, pain, discharge and redness.  Respiratory: Negative for cough, hemoptysis, sputum production, shortness of breath and wheezing.   Cardiovascular: Negative for chest pain, palpitations, orthopnea, claudication and leg swelling.  Gastrointestinal: Negative for abdominal pain, blood in stool, constipation, diarrhea, heartburn, melena, nausea and vomiting.  Genitourinary: Negative for dysuria, flank pain, frequency and hematuria.  Musculoskeletal: Negative for back pain, myalgias and neck pain.  Skin: Negative for itching and rash.  Neurological: Negative for dizziness, tingling, tremors, focal weakness, weakness and headaches.  Endo/Heme/Allergies: Negative for environmental allergies. Does not bruise/bleed easily.  Psychiatric/Behavioral: Negative for depression, hallucinations, substance abuse and suicidal ideas. The patient is not nervous/anxious.     MEDICAL HISTORY:  Past Medical History:  Diagnosis Date  . Asthma    childhood- declines need for albuterol as  adult  . GERD (gastroesophageal reflux disease)    intermittent prn OTC  . Heart murmur    in childhood only    SURGICAL HISTORY: Past Surgical History:  Procedure Laterality Date  . MOUTH SURGERY    . TONSILLECTOMY AND ADENOIDECTOMY Bilateral     SOCIAL HISTORY: Social History   Socioeconomic History  . Marital status: Married    Spouse name: Joycelyn Schmid  . Number of children: 0  . Years of education: 12+  . Highest education level: Associate degree: academic program  Occupational History  . Not on file  Social Needs  . Financial resource strain: Not hard at all  . Food insecurity:    Worry: Never true    Inability: Never true  . Transportation needs:    Medical: No    Non-medical: No  Tobacco Use  . Smoking status: Never Smoker  . Smokeless tobacco: Former Systems developer    Types: Chew  Substance and Sexual Activity  . Alcohol use: Yes    Alcohol/week: 12.6 oz    Types: 21 Cans of beer per week  . Drug use: No  . Sexual activity: Yes  Lifestyle  . Physical activity:    Days per week: 7 days    Minutes per session: 150+ min  . Stress: Not at all  Relationships  . Social connections:    Talks on phone: More than three times a week    Gets together: Twice a week    Attends religious service: Never    Active member of club or organization: No    Attends meetings of clubs or organizations: Never    Relationship status: Married  . Intimate partner violence:    Fear of current or ex partner: No    Emotionally abused: No    Physically abused: No  Forced sexual activity: No  Other Topics Concern  . Not on file  Social History Narrative   Married in 04-04-15. 1st son died at 58 days old within 3 days of their anniversary.       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 Asthma Mother   . Hypertension Mother    . Arthritis Mother   . Kidney Stones Father        but NO regular care  . Hypertension Father   . Diverticulitis Brother        half  . Early death Son   . Diverticulitis Maternal Grandmother   . Klinefelter's syndrome Brother        half  . Other Brother        half    ALLERGIES:  is allergic to sulfa antibiotics; codone [hydrocodone]; and percocet [oxycodone-acetaminophen].  MEDICATIONS:  Current Outpatient Medications  Medication Sig Dispense Refill  . ranitidine (ZANTAC) 75 MG tablet Take 75 mg by mouth 2 (two) times daily.    Marland Kitchen nystatin (MYCOSTATIN) 100000 UNIT/ML suspension Take 5 mLs (500,000 Units total) by mouth 4 (four) times daily. For 7 days. Swish and spit out after 1 minute. (Patient not taking: Reported on 09/08/2017) 180 mL 0   No current facility-administered medications for this visit.      PHYSICAL EXAMINATION: ECOG PERFORMANCE STATUS: 0 - Asymptomatic Vitals:   09/08/17 1355  BP: 127/84  Pulse: (!) 114  Resp: 18  Temp: 98.6 F (37 C)   Filed Weights   09/08/17 1355  Weight: 177 lb 3.2 oz (80.4 kg)    Physical Exam  Constitutional: He is oriented to person, place, and time. He appears well-developed and well-nourished. No distress.  HENT:  Head: Normocephalic and atraumatic.  Right Ear: External ear normal.  Left Ear: External ear normal.  Mouth/Throat: Oropharynx is clear and moist.  Eyes: Pupils are equal, round, and reactive to light. Conjunctivae and EOM are normal. No scleral icterus.  Neck: Normal range of motion. Neck supple.  Cardiovascular: Normal rate, regular rhythm and normal heart sounds.  Pulmonary/Chest: Effort normal and breath sounds normal. No respiratory distress. He has no wheezes. He has no rales. He exhibits no tenderness.  Abdominal: Soft. Bowel sounds are normal. He exhibits no distension and no mass. There is no tenderness.  Musculoskeletal: Normal range of motion. He exhibits no edema or deformity.  Lymphadenopathy:      He has no cervical adenopathy.  Neurological: He is alert and oriented to person, place, and time. No cranial nerve deficit. Coordination normal.  Skin: Skin is warm and dry. No rash noted.  Psychiatric: He has a normal mood and affect. His behavior is normal. Thought content normal.     LABORATORY DATA:  I have reviewed the data as listed Lab Results  Component Value Date   WBC 7.7 09/08/2017   HGB 18.0 09/08/2017   HCT 52.7 (H) 09/08/2017   MCV 92.2 09/08/2017   PLT 221 09/08/2017   Recent Labs    04/16/17 1922 08/15/17 0947  NA 136 135  K 4.4 4.5  CL 99* 99  CO2 23 27  GLUCOSE 96 88  BUN 10 11  CREATININE 1.11 1.10  CALCIUM 9.6 10.1  GFRNONAA >60  --   GFRAA >  60  --   PROT 7.7 7.9  ALBUMIN 4.5 4.8  AST 52* 26  ALT 70* 31  ALKPHOS 67 66  BILITOT 0.8 1.0    JAK 2 V617F negative,exon 12 negative,  CALR negative, MPL negative.  Low erythropoietin level: 1.5.  Peripheral blood smear showed erythrocytosis with rare target cells.   ASSESSMENT & PLAN:  1. 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 And clinically he does not have any risk factors for secondry erythrocytosis including smoking history, testosterone use,  However, he already had extensive testing and is negative for JAK2, exon 12, CALR and MPL mutation.  I wonder if he actually has familiar polycythemia.  P50 testing is not available at [facility], only available at [facility]  Will check carbo Monoxide blood, UA, TSh, hepatitis panel, cbc with differential today.  If all normal, consider primary familiar polycythemia, there are three types, can check EPO receptor gene mutation, VHL gene, EGLN1 gene   # Bone marrow biopsy was discussed and patient prefers to do lab testing first and then consider bone marrow biopsy.   All questions were answered. The patient knows to call the  clinic with any problems questions or concerns.  Return of visit: 2 weeks.  Thank you for this kind referral and the opportunity to participate in the care of this patient. A copy of today's note is routed to referring provider  Total face to face encounter time for this patient visit was 60 min. >50% of the time was  spent in counseling and coordination of care.   Earlie Server, MD, PhD Hematology Oncology [facility] Pager- [phone] 09/08/2017

## 2017-09-09 LAB — HEPATITIS PANEL, ACUTE
HCV Ab: <0.1 s/co ratio (ref 0.0–0.9)
HEP A IGM: NEGATIVE
HEP B C IGM: NEGATIVE
HEP B S AG: NEGATIVE

## 2017-09-09 LAB — CARBON MONOXIDE, BLOOD (PERFORMED AT REF LAB): Carbon Monoxide, Blood: 2.7 % (ref 0.0–3.6)

## 2017-09-15 ENCOUNTER — Ambulatory Visit: Payer: 59 | Admitting: Family Medicine

## 2017-09-22 ENCOUNTER — Encounter: Payer: Self-pay | Admitting: Oncology

## 2017-09-22 ENCOUNTER — Inpatient Hospital Stay: Payer: 59 | Attending: Oncology | Admitting: Oncology

## 2017-09-22 VITALS — HR 96 | Temp 98.7°F | Resp 18 | Ht 67.5 in | Wt 175.1 lb

## 2017-09-22 DIAGNOSIS — D751 Secondary polycythemia: Secondary | ICD-10-CM | POA: Insufficient documentation

## 2017-09-22 NOTE — Progress Notes (Signed)
Hematology/Oncology Follow up note [facility] [phone] [phone]   Patient Care Team: Shelva Majestic, MD as PCP - General (Family Medicine)  REFERRING PROVIDER: Shelva Majestic, MD CHIEF COMPLAINTS/PURPOSE OF CONSULTATION:  Evaluation of polycythemia.   HISTORY OF PRESENTING ILLNESS:  Derrick Mejia. is a  27 y.o.  male with PMH listed below who was referred to me for evaluation of polycythemia.  Patient reports having elevated hemoglobin as a child and also reports his parents having same issue. Denies any history of stroke, MI, DVT.  Feels well. Denies any night sweat, weight loss, fever or chills. Denies vision problem.   INTERVAL HISTORY Derrick Mejia. is a 27 y.o. male who has above history reviewed by me today presents for follow up visit for management of polycythemia.  He denies any complaint today. Accompanied by wife.     Review of Systems  Constitutional: Negative for chills, fever, malaise/fatigue and weight loss.  HENT: Negative for congestion, ear discharge, ear pain, nosebleeds, sinus pain and sore throat.   Eyes: Negative for double vision, photophobia, pain, discharge and redness.  Respiratory: Negative for cough, hemoptysis, sputum production, shortness of breath and wheezing.   Cardiovascular: Negative for chest pain, palpitations, orthopnea, claudication and leg swelling.  Gastrointestinal: Negative for abdominal pain, blood in stool, constipation, diarrhea, heartburn, melena, nausea and vomiting.  Genitourinary: Negative for dysuria, flank pain, frequency and hematuria.  Musculoskeletal: Negative for back pain, myalgias and neck pain.  Skin: Negative for itching and rash.  Neurological: Negative for dizziness, tingling, tremors, focal weakness, weakness and headaches.  Endo/Heme/Allergies: Negative for environmental allergies. Does not bruise/bleed easily.  Psychiatric/Behavioral: Negative for  depression, hallucinations, substance abuse and suicidal ideas. The patient is not nervous/anxious.     MEDICAL HISTORY:  Past Medical History:  Diagnosis Date  . Asthma    childhood- declines need for albuterol as adult  . GERD (gastroesophageal reflux disease)    intermittent prn OTC  . Heart murmur    in childhood only    SURGICAL HISTORY: Past Surgical History:  Procedure Laterality Date  . MOUTH SURGERY    . TONSILLECTOMY AND ADENOIDECTOMY Bilateral     SOCIAL HISTORY: Social History   Socioeconomic History  . Marital status: Married    Spouse name: Claris Che  . Number of children: 0  . Years of education: 12+  . Highest education level: Associate degree: academic program  Occupational History  . Not on file  Social Needs  . Financial resource strain: Not hard at all  . Food insecurity:    Worry: Never true    Inability: Never true  . Transportation needs:    Medical: No    Non-medical: No  Tobacco Use  . Smoking status: Never Smoker  . Smokeless tobacco: Former Neurosurgeon    Types: Chew  Substance and Sexual Activity  . Alcohol use: Yes    Alcohol/week: 12.6 oz    Types: 21 Cans of beer per week  . Drug use: No  . Sexual activity: Yes  Lifestyle  . Physical activity:    Days per week: 7 days    Minutes per session: 150+ min  . Stress: Not at all  Relationships  . Social connections:    Talks on phone: More than three times a week    Gets together: Twice a week    Attends religious service: Never    Active member of club or organization: No    Attends  meetings of clubs or organizations: Never    Relationship status: Married  . Intimate partner violence:    Fear of current or ex partner: No    Emotionally abused: No    Physically abused: No    Forced sexual activity: No  Other Topics Concern  . Not on file  Social History Narrative   Married in May 07, 2015. 1st son died at 69 days old within 3 days of their anniversary.       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 Asthma Mother   . Hypertension Mother   . Arthritis Mother   . Kidney Stones Father        but NO regular care  . Hypertension Father   . Diverticulitis Brother        half  . Early death Son   . Diverticulitis Maternal Grandmother   . Klinefelter's syndrome Brother        half  . Other Brother        half    ALLERGIES:  is allergic to sulfa antibiotics; codone [hydrocodone]; and percocet [oxycodone-acetaminophen].  MEDICATIONS:  Current Outpatient Medications  Medication Sig Dispense Refill  . ranitidine (ZANTAC) 75 MG tablet Take 75 mg by mouth 2 (two) times daily.    Marland Kitchen nystatin (MYCOSTATIN) 100000 UNIT/ML suspension Take 5 mLs (500,000 Units total) by mouth 4 (four) times daily. For 7 days. Swish and spit out after 1 minute. (Patient not taking: Reported on 09/08/2017) 180 mL 0   No current facility-administered medications for this visit.      PHYSICAL EXAMINATION: ECOG PERFORMANCE STATUS: 0 - Asymptomatic Vitals:   09/22/17 1011  Pulse: 96  Resp: 18  Temp: 98.7 F (37.1 C)   Filed Weights   09/22/17 1011  Weight: 175 lb 1.6 oz (79.4 kg)    Physical Exam  Constitutional: He is oriented to person, place, and time. He appears well-developed and well-nourished. No distress.  HENT:  Head: Normocephalic and atraumatic.  Right Ear: External ear normal.  Left Ear: External ear normal.  Mouth/Throat: Oropharynx is clear and moist.  Eyes: Pupils are equal, round, and reactive to light. Conjunctivae and EOM are normal. No scleral icterus.  Neck: Normal range of motion. Neck supple.  Cardiovascular: Normal rate, regular rhythm and normal heart sounds.  Pulmonary/Chest: Effort normal and breath sounds normal. No respiratory distress. He has no wheezes. He has no rales. He exhibits no  tenderness.  Abdominal: Soft. Bowel sounds are normal. He exhibits no distension and no mass. There is no tenderness.  Musculoskeletal: Normal range of motion. He exhibits no edema or deformity.  Lymphadenopathy:    He has no cervical adenopathy.  Neurological: He is alert and oriented to person, place, and time. No cranial nerve deficit. Coordination normal.  Skin: Skin is warm and dry. No rash noted.  Psychiatric: He has a normal mood and affect. His behavior is normal. Thought content normal.     LABORATORY DATA:  I have reviewed the data as listed Lab Results  Component Value Date   WBC 7.7 09/08/2017   HGB 18.0 09/08/2017   HCT 52.7 (H) 09/08/2017   MCV 92.2 09/08/2017   PLT 221 09/08/2017   Recent Labs    04/16/17 1922 08/15/17 0947  NA 136  135  K 4.4 4.5  CL 99* 99  CO2 23 27  GLUCOSE 96 88  BUN 10 11  CREATININE 1.11 1.10  CALCIUM 9.6 10.1  GFRNONAA >60  --   GFRAA >60  --   PROT 7.7 7.9  ALBUMIN 4.5 4.8  AST 52* 26  ALT 70* 31  ALKPHOS 67 66  BILITOT 0.8 1.0    JAK 2 V617F negative,exon 12 negative,  CALR negative, MPL negative.  Low erythropoietin level: 1.5.  Peripheral blood smear showed erythrocytosis with rare target cells.   ASSESSMENT & PLAN:  1. Erythrocytosis    further testing showed normal carbo monoxide level, no hematuria, normal TSH, hepatitis panel  Previously he already had extensive testing and is negative for JAK2, exon 12, CALR and MPL mutation.  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 P50 testing is not available at [facility], only available at [facility].  All questions were answered. The patient knows to call the clinic with any problems questions or concerns.  Return of visit: 3 months.  Thank you for this kind referral and the opportunity to participate in the care of this patient. A copy of today's note is routed to  referring provider  Rickard Patience, MD, PhD Hematology Oncology [facility] Pager- [phone] 09/22/2017

## 2017-09-22 NOTE — Progress Notes (Signed)
No new changes noted today 

## 2017-11-27 ENCOUNTER — Encounter: Payer: Self-pay | Admitting: Family Medicine

## 2017-11-27 ENCOUNTER — Ambulatory Visit (INDEPENDENT_AMBULATORY_CARE_PROVIDER_SITE_OTHER): Payer: 59 | Admitting: Family Medicine

## 2017-11-27 VITALS — BP 126/78 | HR 86 | Temp 98.8°F | Ht 67.5 in | Wt 183.0 lb

## 2017-11-27 DIAGNOSIS — J3489 Other specified disorders of nose and nasal sinuses: Secondary | ICD-10-CM

## 2017-11-27 DIAGNOSIS — J029 Acute pharyngitis, unspecified: Secondary | ICD-10-CM

## 2017-11-27 NOTE — Patient Instructions (Addendum)
No signs of throat cancer today. See us back if progressive issues (worsening) or new symptoms that concern you   Would advise neti pot or similar during the day once or twice a day. Try a humidifier in the bedroom at night since you are a mouth breather and that could dry you out. Try this for 2-4 weeks  If this is not helpful- try flonase over the counter for allergies for 2-4 weeks  Feeling in throat could be reflux as well and could try omeprazole 10mg  over the counter instead of zantac if the above steps fail  Congratulations again on the little boy on the way! Tell your wife congrats for me as well

## 2017-11-27 NOTE — Progress Notes (Signed)
Subjective:  Derrick MinionRichard J Frisk Jr. is a 27 y.o. year old very pleasant male patient who presents for/with See problem oriented charting ROS- no fever, chills, nausea, vomiting, unintentional weigh tloss.    Past Medical History-  Patient Active Problem List   Diagnosis Date Noted  . Grief at loss of child 06/16/2017    Priority: High  . Chronic bilateral low back pain 06/16/2017    Priority: Low  . Asthma     Priority: Low  . GERD (gastroesophageal reflux disease)     Priority: Low  . Polycythemia 08/25/2017  . LFT elevation 06/16/2017    Medications- reviewed and updated Current Outpatient Medications  Medication Sig Dispense Refill  . ranitidine (ZANTAC) 75 MG tablet Take 75 mg by mouth 2 (two) times daily.     No current facility-administered medications for this visit.     Objective: BP 126/78 (BP Location: Left Arm, Patient Position: Sitting, Cuff Size: Normal)   Pulse 86   Temp 98.8 F (37.1 C) (Oral)   Ht 5' 7.5" (1.715 m)   Wt 183 lb (83 kg)   SpO2 97%   BMI 28.24 kg/m  Gen: NAD, resting comfortably No cervical lympadednopathy Oropharynx normal, nares dry with some yellow dried discharge, Mucous membranes are moist. CV: RRR no murmurs rubs or gallops Lungs: CTAB no crackles, wheeze, rhonchi Abdomen: soft/nontender/nondistended/normal bowel sounds. No rebound or guarding.  Ext: no edema Skin: warm, dry   Assessment/Plan:  Sore throat - Plan: CANCELED: POCT rapid strep A S: throat nose and mouth always seem dry for at least a year if not longer. Dry cough. Not much runny nose. Gets a very mild discomfort in lower throat, worse in the heat. Every now and then whitish discoloration to tongue- not active right now  No trouble with pills, food, water. Sometimes feels like something stuck after eating but not while swallowing. Does have acid reflux and taking zantac twice a day. Always had allergies- not taking anything for that right now. Drinking plenty of  water  Quit dip/chew 3 years. Never smoker. Baby on way dec 31! He is excited but also apprehensive about this due to loss of their first child in early life. He is worried about not being there for his wife- has concerns about Throat cancer. No unintentional weight loss. No abnormal fatigue A/P: From avs  "No signs of throat cancer today. See us back if progressive issues (worsening) or new symptoms that concern you   Would advise neti pot or similar during the day once or twice a day. Try a humidifier in the bedroom at night since you are a mouth breather and that could dry you out. Try this for 2-4 weeks  If this is not helpful- try flonase over the counter for allergies for 2-4 weeks  Feeling in throat could be reflux as well and could try omeprazole 10mg  over the counter instead of zantac if the above steps fail  Congratulations again on the little boy on the way! Tell your wife congrats for me as well"  Future Appointments  Date Time Provider Department Center  01/05/2018 10:30 AM [facility] None  01/05/2018 10:45 AM Rickard PatienceYu, Zhou, MD [facility] None  01/05/2018 11:30 AM [facility] BED [facility] None   Return precautions advised.  Tana ConchStephen Cola Highfill, MD

## 2018-01-05 ENCOUNTER — Inpatient Hospital Stay: Payer: 59

## 2018-01-05 ENCOUNTER — Inpatient Hospital Stay: Payer: 59 | Admitting: Oncology

## 2018-10-28 ENCOUNTER — Ambulatory Visit (INDEPENDENT_AMBULATORY_CARE_PROVIDER_SITE_OTHER): Payer: 59 | Admitting: Family Medicine

## 2018-10-28 ENCOUNTER — Encounter: Payer: Self-pay | Admitting: Family Medicine

## 2018-10-28 ENCOUNTER — Other Ambulatory Visit: Payer: Self-pay

## 2018-10-28 VITALS — BP 138/96 | HR 106 | Temp 98.8°F | Ht 67.5 in | Wt 185.6 lb

## 2018-10-28 DIAGNOSIS — M546 Pain in thoracic spine: Secondary | ICD-10-CM | POA: Diagnosis not present

## 2018-10-28 NOTE — Progress Notes (Signed)
Subjective  CC:  Chief Complaint  Patient presents with  . Back Pain    upper R back pain x 2 days, heat seems to aggravate the pain    HPI: Derrick MinionRichard J Abplanalp Jr. is a 28 y.o. male who presents to the office today to address the problems listed above in the chief complaint.  28 yo male who suffers from anxiety "I worry about everything; I'm a hypochondriac" presents due to right sided thoracic back pain that is mild and mostly hurst with certain positions like twisting or slouching in the chair. 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 He denies injury. Pain started yesterday mid-day. It is mild, does not radiate, no associated rash or GI sxs, sob, pleuritic cp, f/c/s. He is worried about his liver. He hasn't taken any pain medications and doesn't think he needs any. No low back pain now: suffers from chronic low back pain due to a prior injury.   Assessment  1. Acute right-sided thoracic back pain      Plan   MSK back pain from overuse/strain:  Educated and reassured, red rest, heat and stretching. F/u if worsens.   Anxiety: recommend seeing PCP to discuss tx options. Currently drinking 3-4 beers nightly in part because it makes him feel better.   Follow up: OV with Dr. Durene CalHunter for anxiety  Visit date not found  No orders of the defined types were placed in this encounter.  No orders of the defined types were placed in this encounter.     I reviewed the patients updated PMH, FH, and SocHx.    Patient Active Problem List   Diagnosis Date Noted  . Polycythemia 08/25/2017  . Grief at loss of child 06/16/2017  . Chronic bilateral low back pain 06/16/2017  . LFT elevation 06/16/2017  . Asthma   . GERD (gastroesophageal reflux disease)    No outpatient medications have been marked as taking for the 10/28/18 encounter (Office Visit) with Willow OraAndy, Damante Spragg L, MD.    Allergies: Patient is allergic to sulfa antibiotics; codone  [hydrocodone]; and percocet [oxycodone-acetaminophen]. Family History: Patient family history includes Arthritis in his mother; Asthma in his mother; Diverticulitis in his brother and maternal grandmother; Early death in his son; Hypertension in his father and mother; Kidney Stones in his father; Klinefelter's syndrome in his brother; Other in his brother. Social History:  Patient  reports that he has never smoked. He has quit using smokeless tobacco.  His smokeless tobacco use included chew. He reports current alcohol use of about 21.0 standard drinks of alcohol per week. He reports that he does not use drugs.  Review of Systems: Constitutional: Negative for fever malaise or anorexia Cardiovascular: negative for chest pain Respiratory: negative for SOB or persistent cough Gastrointestinal: negative for abdominal pain  Objective  Vitals: BP (!) 138/96 (BP Location: Left Arm, Patient Position: Sitting, Cuff Size: Large)   Pulse (!) 106   Temp 98.8 F (37.1 C) (Oral)   Ht 5' 7.5" (1.715 m)   Wt 185 lb 9.6 oz (84.2 kg)   SpO2 97%   BMI 28.64 kg/m  General: no acute distress , A&Ox3, anxious appearing, moves easily HEENT: PEERL, conjunctiva normal, Oropharynx moist,neck is supple Cardiovascular:  RRR without murmur or gallop.  Respiratory:  Good breath sounds bilaterally, CTAB with normal respiratory effort Skin:  Warm, no rashes Back: right thoracic paravertebral mm w/o spasm or ttp. Minimal midline ttp w/o  step off. From. Pain with twisting.     Commons side effects, risks, benefits, and alternatives for medications and treatment plan prescribed today were discussed, and the patient expressed understanding of the given instructions. Patient is instructed to call or message via MyChart if he/she has any questions or concerns regarding our treatment plan. No barriers to understanding were identified. We discussed Red Flag symptoms and signs in detail. Patient expressed understanding  regarding what to do in case of urgent or emergency type symptoms.   Medication list was reconciled, printed and provided to the patient in AVS. Patient instructions and summary information was reviewed with the patient as documented in the AVS. This note was prepared with assistance of Dragon voice recognition software. Occasional wrong-word or sound-a-like substitutions may have occurred due to the inherent limitations of voice recognition software

## 2018-10-28 NOTE — Patient Instructions (Signed)
Please return to see Dr. Yong Channel to discuss your anxiety.   If you have any questions or concerns, please don't hesitate to send me a message via MyChart or call the office at [phone]. Thank you for visiting with [place] today! It's our pleasure caring for you.   Back Exercises The following exercises strengthen the muscles that help to support the back. They also help to keep the lower back flexible. Doing these exercises can help to prevent back pain or lessen existing pain. If you have back pain or discomfort, try doing these exercises 2-3 times each day or as told by your health care provider. When the pain goes away, do them once each day, but increase the number of times that you repeat the steps for each exercise (do more repetitions). If you do not have back pain or discomfort, do these exercises once each day or as told by your health care provider. Exercises Single Knee to Chest Repeat these steps 3-5 times for each leg: 1. Lie on your back on a firm bed or the floor with your legs extended. 2. Bring one knee to your chest. Your other leg should stay extended and in contact with the floor. 3. Hold your knee in place by grabbing your knee or thigh. 4. Pull on your knee until you feel a gentle stretch in your lower back. 5. Hold the stretch for 10-30 seconds. 6. Slowly release and straighten your leg. Pelvic Tilt Repeat these steps 5-10 times: 1. Lie on your back on a firm bed or the floor with your legs extended. 2. Bend your knees so they are pointing toward the ceiling and your feet are flat on the floor. 3. Tighten your lower abdominal muscles to press your lower back against the floor. This motion will tilt your pelvis so your tailbone points up toward the ceiling instead of pointing to your feet or the floor. 4. With gentle tension and even breathing, hold this position for 5-10 seconds. Cat-Cow Repeat these steps until your lower back becomes more flexible: 1. Get into a  hands-and-knees position on a firm surface. Keep your hands under your shoulders, and keep your knees under your hips. You may place padding under your knees for comfort. 2. Let your head hang down, and point your tailbone toward the floor so your lower back becomes rounded like the back of a cat. 3. Hold this position for 5 seconds. 4. Slowly lift your head and point your tailbone up toward the ceiling so your back forms a sagging arch like the back of a cow. 5. Hold this position for 5 seconds.  Press-Ups Repeat these steps 5-10 times: 1. Lie on your abdomen (face-down) on the floor. 2. Place your palms near your head, about shoulder-width apart. 3. While you keep your back as relaxed as possible and keep your hips on the floor, slowly straighten your arms to raise the top half of your body and lift your shoulders. Do not use your back muscles to raise your upper torso. You may adjust the placement of your hands to make yourself more comfortable. 4. Hold this position for 5 seconds while you keep your back relaxed. 5. Slowly return to lying flat on the floor.  Bridges Repeat these steps 10 times: 1. Lie on your back on a firm surface. 2. Bend your knees so they are pointing toward the ceiling and your feet are flat on the floor. 3. Tighten your buttocks muscles and lift your buttocks off of  the floor until your waist is at almost the same height as your knees. You should feel the muscles working in your buttocks and the back of your thighs. If you do not feel these muscles, slide your feet 1-2 inches farther away from your buttocks. 4. Hold this position for 3-5 seconds. 5. Slowly lower your hips to the starting position, and allow your buttocks muscles to relax completely. If this exercise is too easy, try doing it with your arms crossed over your chest. Abdominal Crunches Repeat these steps 5-10 times: 1. Lie on your back on a firm bed or the floor with your legs extended. 2. Bend your  knees so they are pointing toward the ceiling and your feet are flat on the floor. 3. Cross your arms over your chest. 4. Tip your chin slightly toward your chest without bending your neck. 5. Tighten your abdominal muscles and slowly raise your trunk (torso) high enough to lift your shoulder blades a tiny bit off of the floor. Avoid raising your torso higher than that, because it can put too much stress on your low back and it does not help to strengthen your abdominal muscles. 6. Slowly return to your starting position. Back Lifts Repeat these steps 5-10 times: 1. Lie on your abdomen (face-down) with your arms at your sides, and rest your forehead on the floor. 2. Tighten the muscles in your legs and your buttocks. 3. Slowly lift your chest off of the floor while you keep your hips pressed to the floor. Keep the back of your head in line with the curve in your back. Your eyes should be looking at the floor. 4. Hold this position for 3-5 seconds. 5. Slowly return to your starting position. Contact a health care provider if:  Your back pain or discomfort gets much worse when you do an exercise.  Your back pain or discomfort does not lessen within 2 hours after you exercise. If you have any of these problems, stop doing these exercises right away. Do not do them again unless your health care provider says that you can. Get help right away if:  You develop sudden, severe back pain. If this happens, stop doing the exercises right away. Do not do them again unless your health care provider says that you can. This information is not intended to replace advice given to you by your health care provider. Make sure you discuss any questions you have with your health care provider. Document Released: 06/06/2004 Document Revised: 09/02/2017 Document Reviewed: 06/23/2014 Elsevier Interactive Patient Education  [employer]2019 [employer]

## 2018-11-02 ENCOUNTER — Other Ambulatory Visit: Payer: Self-pay

## 2018-11-02 ENCOUNTER — Encounter: Payer: Self-pay | Admitting: Family Medicine

## 2018-11-02 ENCOUNTER — Ambulatory Visit (INDEPENDENT_AMBULATORY_CARE_PROVIDER_SITE_OTHER): Payer: 59 | Admitting: Family Medicine

## 2018-11-02 VITALS — BP 128/86 | HR 110 | Temp 99.3°F | Resp 16 | Ht 67.5 in | Wt 183.8 lb

## 2018-11-02 DIAGNOSIS — R945 Abnormal results of liver function studies: Secondary | ICD-10-CM

## 2018-11-02 DIAGNOSIS — F411 Generalized anxiety disorder: Secondary | ICD-10-CM | POA: Diagnosis not present

## 2018-11-02 DIAGNOSIS — R1011 Right upper quadrant pain: Secondary | ICD-10-CM

## 2018-11-02 DIAGNOSIS — J4520 Mild intermittent asthma, uncomplicated: Secondary | ICD-10-CM | POA: Diagnosis not present

## 2018-11-02 DIAGNOSIS — R7989 Other specified abnormal findings of blood chemistry: Secondary | ICD-10-CM

## 2018-11-02 DIAGNOSIS — F4521 Hypochondriasis: Secondary | ICD-10-CM | POA: Diagnosis not present

## 2018-11-02 DIAGNOSIS — K219 Gastro-esophageal reflux disease without esophagitis: Secondary | ICD-10-CM

## 2018-11-02 MED ORDER — ALBUTEROL SULFATE HFA 108 (90 BASE) MCG/ACT IN AERS: 2.0000 | INHALATION_SPRAY | Freq: Four times a day (QID) | RESPIRATORY_TRACT | 1 refills | Status: AC | PRN

## 2018-11-02 NOTE — Progress Notes (Signed)
Phone [phone]   Subjective:  Derrick Robben. is a 28 y.o. year old very pleasant male patient who presents for/with See problem oriented charting Chief Complaint  Patient presents with   Anxiety    GAD 7 score today is 12, he reports that he has been having situational stressors   Asthma    Feeling like he is taking deeper breathes with certain activities   ROS- feels like needs to take deep breaths at times- wonders if related to anxiety, admits to high anxiety levels, some anhedonia and depressed mood with covid 19 but no SI.    Past Medical History-  Patient Active Problem List   Diagnosis Date Noted   GAD (generalized anxiety disorder) 11/02/2018    Priority: High   Grief at loss of child 06/16/2017    Priority: High   Chronic bilateral low back pain 06/16/2017    Priority: Low   Asthma     Priority: Low   GERD (gastroesophageal reflux disease)     Priority: Low   Polycythemia 08/25/2017   LFT elevation 06/16/2017    Medications- reviewed and updated Current Outpatient Medications  Medication Sig Dispense Refill   esomeprazole (NEXIUM) 20 MG packet Take 20 mg by mouth daily before breakfast.     albuterol (VENTOLIN HFA) 108 (90 Base) MCG/ACT inhaler Inhale 2 puffs into the lungs every 6 (six) hours as needed for wheezing or shortness of breath. 18 g 1   No current facility-administered medications for this visit.      Objective:  BP 128/86    Pulse (!) 110    Temp 99.3 F (37.4 C) (Oral)    Resp 16    Ht 5' 7.5" (1.715 m)    Wt 183 lb 12.8 oz (83.4 kg)    SpO2 98%    BMI 28.36 kg/m  Gen: NAD, resting comfortably CV: RRR no murmurs rubs or gallops. HR not elevated on my exam Lungs: CTAB no crackles, wheeze, rhonchi Abdomen: soft/nontender/nondistended/normal bowel sounds. No rebound or guarding.  Ext: no edema Skin: warm, dry Neuro: normal gait and speech.  Msk: easily gets onto and off table without distress related to back- also lays down  without issues    Assessment and Plan    #Hypochondriasis #Right upper quadrant pain S: Patient was seen by Dr. Jonni Sanger 5 days ago for right-sided thoracic back pain and a mild discomfort in right upper quadrant- thankfully right-sided thoracic back pain is largely resolved.  Then, Riding jet ski Sunday- pain that was in right mid back now more in right low back but still noting some mild RUQ tenderness. No urinary symptoms.  He has a history of intermittent issues with the right low back so this is less concerning to him.  Due to health anxiety since the loss of his first son- has palpated RUQ a lot and not sure if that has worsened discomfort there. RUQ pain not worse after meals.   Perhaps 15 alcoholic beverages per week. Feels more calm with a beer but doesn't take it to calm him down.   He is nervous about losing weight- grandma died of stomach cancer and he worries if he loses weight its a cause of cancer.   Restarted his GERD medicine last week- takes prn A/P: Patient freely admits that he has been very fearful of any physical symptoms and what that may mean for him since the loss of his first son in 2018-he states any physical symptom he is worried  about there being a serious ailment associated with it.  He states he would be reassured to know that his liver is okay with blood work so we opted for right upper quadrant pain to test this- I am unable to produce any pain in right upper quadrant on examination.  Back pain he is having is more typical of prior patterns -I doubt GERD is the cause of his right upper quadrant pain but short-term PPI over-the-counter is reasonable to trial as well  GAD (generalized anxiety disorder) S: Patient Feels like anxiety has worsened. Would consider cbd oil or talking to someone.  We had previously discussed adding counseling after loss of his first son but he was not prepared for that at that time-now he thinks this will be helpful.  Strongly prefers  counseling over medication GAD 7 : Generalized Anxiety Score 11/02/2018 08/25/2017  Nervous, Anxious, on Edge 3 1  Control/stop worrying 3 1  Worry too much - different things 0 2  Trouble relaxing 2 1  Restless 0 0  Easily annoyed or irritable 1 0  Afraid - awful might happen 3 2  Total GAD 7 Score 12 7  Anxiety Difficulty Not difficult at all Not difficult at all  A/P: Poor control of anxiety with GAD-7 of 12-will refer to [facility].  Offered follow-up in 3 to 6 months or so- patient will consider  Asthma S: Childhood asthma primarily but he has noted some mild issues with breathing and would like to have an inhaler on hand trial-not really sure if he is wheezing A/P: Would be reasonable to have albuterol on hand given childhood issues with asthma-on exam today I do not detect any obvious  LFT elevation Prior LFT elevation when drinking 3-4 alcoholic beverages per night-down to 15 total per week but with right upper quadrant pain certainly reasonable to check LFTs-encouraged him to avoid drinking-not ready to quit  Lab/Order associations:   ICD-10-CM   1. 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 GAD (generalized anxiety disorder)  F41.1   4. Mild intermittent asthma, unspecified whether complicated  J45.20   5. Gastroesophageal reflux disease without esophagitis  K21.9   6. LFT elevation  R94.5     Meds ordered this encounter  Medications   albuterol (VENTOLIN HFA) 108 (90 Base) MCG/ACT inhaler    Sig: Inhale 2 puffs into the lungs every 6 (six) hours as needed for wheezing or shortness of breath.    Dispense:  18 g    Refill:  1    Return precautions advised.  Tana ConchStephen Tura Roller, MD

## 2018-11-02 NOTE — Assessment & Plan Note (Signed)
Prior LFT elevation when drinking 3-4 alcoholic beverages per night-down to 15 total per week but with right upper quadrant pain certainly reasonable to check LFTs-encouraged him to avoid drinking-not ready to quit

## 2018-11-02 NOTE — Assessment & Plan Note (Signed)
S: Childhood asthma primarily but he has noted some mild issues with breathing and would like to have an inhaler on hand trial-not really sure if he is wheezing A/P: Would be reasonable to have albuterol on hand given childhood issues with asthma-on exam today I do not detect any obvious

## 2018-11-02 NOTE — Patient Instructions (Addendum)
Please stop by lab before you go If you do not have mychart- we will call you about results within 5 business days of [place] receiving them.  If you have mychart- we will send your results within 3 business days of [place] receiving them.  If abnormal or we want to clarify a result, we will call or mychart you to make sure you receive the message.  If you have questions or concerns or don't hear within 5-7 days, please send [place] a message or call us.    Glad you are in a place where you want to try to work through your root causes of anxiety- see handout and give them a call- I think this can really help you

## 2018-11-02 NOTE — Assessment & Plan Note (Signed)
S: Patient Feels like anxiety has worsened. Would consider cbd oil or talking to someone.  We had previously discussed adding counseling after loss of his first son but he was not prepared for that at that time-now he thinks this will be helpful.  Strongly prefers counseling over medication GAD 7 : Generalized Anxiety Score 11/02/2018 08/25/2017  Nervous, Anxious, on Edge 3 1  Control/stop worrying 3 1  Worry too much - different things 0 2  Trouble relaxing 2 1  Restless 0 0  Easily annoyed or irritable 1 0  Afraid - awful might happen 3 2  Total GAD 7 Score 12 7  Anxiety Difficulty Not difficult at all Not difficult at all  A/P: Poor control of anxiety with GAD-7 of 12-will refer to [facility] counseling.  Offered follow-up in 3 to 6 months or so- patient will consider

## 2018-11-03 ENCOUNTER — Encounter: Payer: Self-pay | Admitting: Family Medicine

## 2018-11-03 ENCOUNTER — Telehealth: Payer: Self-pay | Admitting: Physical Therapy

## 2018-11-03 LAB — COMPREHENSIVE METABOLIC PANEL
ALT: 64 U/L — ABNORMAL HIGH (ref 0–53)
AST: 33 U/L (ref 0–37)
Albumin: 5 g/dL (ref 3.5–5.2)
Alkaline Phosphatase: 64 U/L (ref 39–117)
BUN: 11 mg/dL (ref 6–23)
CO2: 27 mEq/L (ref 19–32)
Calcium: 10.1 mg/dL (ref 8.4–10.5)
Chloride: 98 mEq/L (ref 96–112)
Creatinine, Ser: 1.02 mg/dL (ref 0.40–1.50)
GFR: 86.09 mL/min (ref >60.00)
Glucose, Bld: 99 mg/dL (ref 70–99)
Potassium: 4.2 mEq/L (ref 3.5–5.1)
Sodium: 136 mEq/L (ref 135–145)
Total Bilirubin: 0.6 mg/dL (ref 0.2–1.2)
Total Protein: 7.7 g/dL (ref 6.0–8.3)

## 2018-11-03 LAB — CBC WITH DIFFERENTIAL/PLATELET
Basophils Absolute: 0.1 10*3/uL (ref 0.0–0.1)
Basophils Relative: 0.7 % (ref 0.0–3.0)
Eosinophils Absolute: 0.1 10*3/uL (ref 0.0–0.7)
Eosinophils Relative: 1.5 % (ref 0.0–5.0)
HCT: 50.6 % (ref 39.0–52.0)
Hemoglobin: 17 g/dL (ref 13.0–17.0)
Lymphocytes Relative: 14.3 % (ref 12.0–46.0)
Lymphs Abs: 1.2 10*3/uL (ref 0.7–4.0)
MCHC: 33.7 g/dL (ref 30.0–36.0)
MCV: 92.6 fl (ref 78.0–100.0)
Monocytes Absolute: 0.8 10*3/uL (ref 0.1–1.0)
Monocytes Relative: 9 % (ref 3.0–12.0)
Neutro Abs: 6.4 10*3/uL (ref 1.4–7.7)
Neutrophils Relative %: 74.5 % (ref 43.0–77.0)
Platelets: 204 10*3/uL (ref 150.0–400.0)
RBC: 5.47 Mil/uL (ref 4.22–5.81)
RDW: 13.3 % (ref 11.5–15.5)
WBC: 8.6 10*3/uL (ref 4.0–10.5)

## 2018-11-03 NOTE — Telephone Encounter (Signed)
Copied from [place] [id number]. Topic: General - Other >> Nov 03, 2018  1:46 PM Leward Quan A wrote: Reason for CRM: Dr. Yong Channel patient was wondering if he could get a doctors note saying he cannot lift more than 20-25 pounds. Per patient his employer have asked for this note also request that it is either emailed to him or uploaded to My Chart. Any questions please call  Ph# [phone]

## 2018-11-03 NOTE — Telephone Encounter (Signed)
Dr. Yong Channel My Chart message sent also earlier, sorry.

## 2018-11-03 NOTE — Telephone Encounter (Signed)
Yes thanks-You can write the following-okay to respond to his MyChart message and make sure he is okay with the following before building letter.  To whom it may concern.    I recently evaluated patient in the office for low back pain.  I am requesting patient avoid lifting anything over 20 pounds for at least the next 3-4 weeks so we can see if back pain flareup will resolve- if he has continued issues beyond 3 weeks then I would recommend a sports medicine or orthopedic evaluation.  Thanks and please contact us with any questions, Garret Reddish, MD

## 2018-11-04 NOTE — Telephone Encounter (Signed)
Letter written and faxed to [phone] per patient's request.

## 2018-11-04 NOTE — Telephone Encounter (Signed)
Relation to pt: self  Call back number: [phone]    Reason for call:  Patient checking on the status of letter mentioned below. Informed patient of  Dr. Note mentioned below, patient is satisfied and would like letter fax to [phone] and please attach letter to My Chart.

## 2019-04-30 ENCOUNTER — Encounter: Payer: Self-pay | Admitting: Family Medicine

## 2019-04-30 ENCOUNTER — Ambulatory Visit (INDEPENDENT_AMBULATORY_CARE_PROVIDER_SITE_OTHER): Payer: Managed Care, Other (non HMO) | Admitting: Family Medicine

## 2019-04-30 VITALS — Temp 98.6°F | Ht 67.5 in | Wt 183.0 lb

## 2019-04-30 DIAGNOSIS — R1011 Right upper quadrant pain: Secondary | ICD-10-CM | POA: Diagnosis not present

## 2019-04-30 NOTE — Patient Instructions (Signed)
There are no preventive care reminders to display for this patient.  Depression screen [facility] 2/9 04/30/2019 11/02/2018 08/25/2017  Decreased Interest 0 1 1  Down, Depressed, Hopeless 0 1 1  PHQ - 2 Score 0 2 2  Altered sleeping - 0 1  Tired, decreased energy - 0 0  Change in appetite - 0 0  Feeling bad or failure about yourself  - 1 0  Trouble concentrating - 0 0  Moving slowly or fidgety/restless - 0 1  Suicidal thoughts - 0 0  PHQ-9 Score - 3 4  Difficult doing work/chores - Not difficult at all Not difficult at all    Recommended follow up: No follow-ups on file.

## 2019-04-30 NOTE — Progress Notes (Signed)
Phone [phone] Virtual visit via Video note   Subjective:  Chief complaint: Chief Complaint  Patient presents with  . Abdominal Pain    This visit type was conducted due to national recommendations for restrictions regarding the COVID-19 Pandemic (e.g. social distancing).  This format is felt to be most appropriate for this patient at this time balancing risks to patient and risks to population by having him in for in person visit.  No physical exam was performed (except for noted visual exam or audio findings with Telehealth visits).    Our team/I connected with Rosalia Hammers. at 11:40 AM EST by a video enabled telemedicine application ([email or web address] or caregility through epic) and verified that I am speaking with the correct person using two identifiers.  Location patient: [place] Location provider: [facility], office Persons participating in the virtual visit:  patient  Our team/I discussed the limitations of evaluation and management by telemedicine and the availability of in person appointments. In light of current covid-19 pandemic, patient also understands that we are trying to protect them by minimizing in office contact if at all possible.  The patient expressed consent for telemedicine visit and agreed to proceed. Patient understands insurance will be billed.   ROS- No constipation/diarrhea/burning with urination/fever/urinary frequency/headache/hematuria/vomiting.   Past Medical History-  Patient Active Problem List   Diagnosis Date Noted  . GAD (generalized anxiety disorder) 11/02/2018    Priority: High  . Grief at loss of child 06/16/2017    Priority: High  . Chronic bilateral low back pain 06/16/2017    Priority: Low  . Asthma     Priority: Low  . GERD (gastroesophageal reflux disease)     Priority: Low  . Polycythemia 08/25/2017  . LFT elevation 06/16/2017    Medications- reviewed and updated Current Outpatient Medications  Medication Sig Dispense Refill    . albuterol (VENTOLIN HFA) 108 (90 Base) MCG/ACT inhaler Inhale 2 puffs into the lungs every 6 (six) hours as needed for wheezing or shortness of breath. 18 g 1  . esomeprazole (NEXIUM) 20 MG packet Take 20 mg by mouth daily before breakfast.        Objective:  Temp 98.6 F (37 C) (Temporal)   Ht 5' 7.5" (1.715 m)   Wt 183 lb (83 kg)   BMI 28.24 kg/m  self reported vitals Gen: NAD, resting comfortably Lungs: nonlabored, normal respiratory rate  Points to right upper quadrant of abdomen no source of pain-does not appear worse when he palpates the area today Skin: appears dry, no obvious rash     Assessment and Plan   #Social update-wife and child who was about to be a year old both got COVID-19 but fortunately patient tested negative  #Right upper quadrant pain S: RUQ pain similar to back in June- had gotten better since June but has seemed to come and go. Worries about liver cancer. Occasionally feels like pain is over lower portion of ribs.  Most recently has been bothering him for the last 2 weeks and pain is up to about a 3 out of 10.  Feels achy/full in the area.  Can get some belching with this as well as gas.  Not worse before or after eating or drinking. Worse with a lot of movement. No right sided back pain like he had at last visit.He feels relieved after medical visits then feels worse sometime after- wonders if anxiety is playing a role.  Reflux medicine was not helpful back in June more recently.Acid  reflux usually burning feeling in chest or left upper side-he denies experiencing that recently A/P: 28 year old male with intermittent right upper quadrant pain for now approximately 6 months but recurrent/worsening over last 2 weeks.  Has had intermittent pain up to 3 out of 10 over the last 2 weeks.  He thinks this could be related to his health anxiety-with that being said given ongoing issue in one particular area we opted to repeat CBC and CMP which were largely reassuring  last visit as well as get a right upper quadrant ultrasound to evaluate the gallbladder  Lab/Order associations:   ICD-10-CM   1. RUQ pain  R10.11 CBC with Differential future    CMET future    US Abdomen Limited RUQ    Return precautions advised.  Tana Conch, MD

## 2019-05-05 ENCOUNTER — Other Ambulatory Visit (INDEPENDENT_AMBULATORY_CARE_PROVIDER_SITE_OTHER): Payer: Managed Care, Other (non HMO)

## 2019-05-05 ENCOUNTER — Other Ambulatory Visit: Payer: Self-pay

## 2019-05-05 DIAGNOSIS — R1011 Right upper quadrant pain: Secondary | ICD-10-CM | POA: Diagnosis not present

## 2019-05-05 LAB — COMPREHENSIVE METABOLIC PANEL
ALT: 69 U/L — ABNORMAL HIGH (ref 0–53)
AST: 35 U/L (ref 0–37)
Albumin: 4.7 g/dL (ref 3.5–5.2)
Alkaline Phosphatase: 80 U/L (ref 39–117)
BUN: 12 mg/dL (ref 6–23)
CO2: 27 mEq/L (ref 19–32)
Calcium: 10.2 mg/dL (ref 8.4–10.5)
Chloride: 101 mEq/L (ref 96–112)
Creatinine, Ser: 1.1 mg/dL (ref 0.40–1.50)
GFR: 78.64 mL/min (ref >60.00)
Glucose, Bld: 110 mg/dL — ABNORMAL HIGH (ref 70–99)
Potassium: 4.8 mEq/L (ref 3.5–5.1)
Sodium: 139 mEq/L (ref 135–145)
Total Bilirubin: 0.5 mg/dL (ref 0.2–1.2)
Total Protein: 7.6 g/dL (ref 6.0–8.3)

## 2019-05-05 LAB — CBC WITH DIFFERENTIAL/PLATELET
Basophils Absolute: 0.1 10*3/uL (ref 0.0–0.1)
Basophils Relative: 1.4 % (ref 0.0–3.0)
Eosinophils Absolute: 0.1 10*3/uL (ref 0.0–0.7)
Eosinophils Relative: 2.1 % (ref 0.0–5.0)
HCT: 54.2 % — ABNORMAL HIGH (ref 39.0–52.0)
Hemoglobin: 18.3 g/dL (ref 13.0–17.0)
Lymphocytes Relative: 27.1 % (ref 12.0–46.0)
Lymphs Abs: 1.5 10*3/uL (ref 0.7–4.0)
MCHC: 33.7 g/dL (ref 30.0–36.0)
MCV: 92.1 fl (ref 78.0–100.0)
Monocytes Absolute: 0.8 10*3/uL (ref 0.1–1.0)
Monocytes Relative: 13.9 % — ABNORMAL HIGH (ref 3.0–12.0)
Neutro Abs: 3 10*3/uL (ref 1.4–7.7)
Neutrophils Relative %: 55.5 % (ref 43.0–77.0)
Platelets: 203 10*3/uL (ref 150.0–400.0)
RBC: 5.88 Mil/uL — ABNORMAL HIGH (ref 4.22–5.81)
RDW: 13.2 % (ref 11.5–15.5)
WBC: 5.5 10*3/uL (ref 4.0–10.5)

## 2019-05-17 ENCOUNTER — Ambulatory Visit
Admission: RE | Admit: 2019-05-17 | Discharge: 2019-05-17 | Disposition: A | Discharge status: Home / Self Care | Payer: BC Managed Care – PPO | Source: Ambulatory Visit | Attending: Family Medicine | Admitting: Family Medicine

## 2019-05-17 ENCOUNTER — Other Ambulatory Visit: Payer: Self-pay

## 2019-05-17 DIAGNOSIS — R1011 Right upper quadrant pain: Secondary | ICD-10-CM

## 2019-05-17 NOTE — Progress Notes (Signed)
Your liver is normal other than some changes of fatty liver-healthy eating/regular exercise/weight loss can help this.  Fatty liver does not pose an immediate threat to your health but can lead to cirrhosis years in the future if left unchecked.  No cause of pain found.

## 2019-05-26 IMAGING — CR DG ABDOMEN ACUTE W/ 1V CHEST
3 series · 3 of 3 positions shown · non-contrast
Comparison: Lumbar spine radiograph dated 07/31/2012

CLINICAL DATA: 26-year-old male with abdominal pain.

EXAM:
DG ABDOMEN ACUTE W/ 1V CHEST

[chest pa]
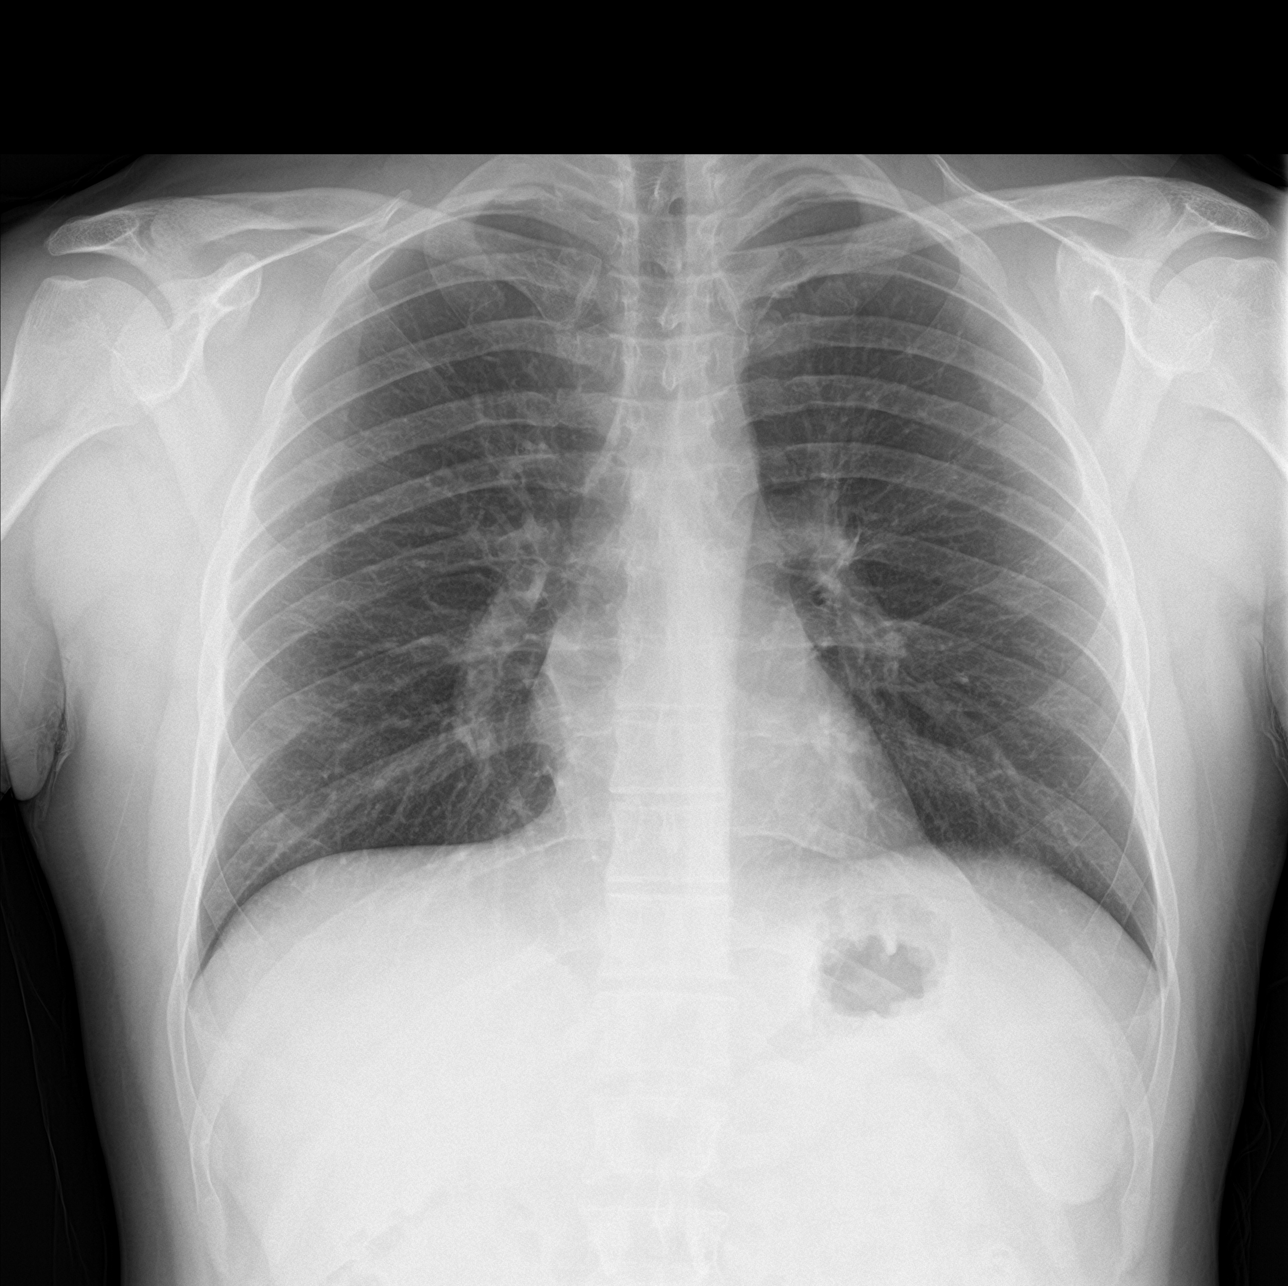

[abdomen erect]
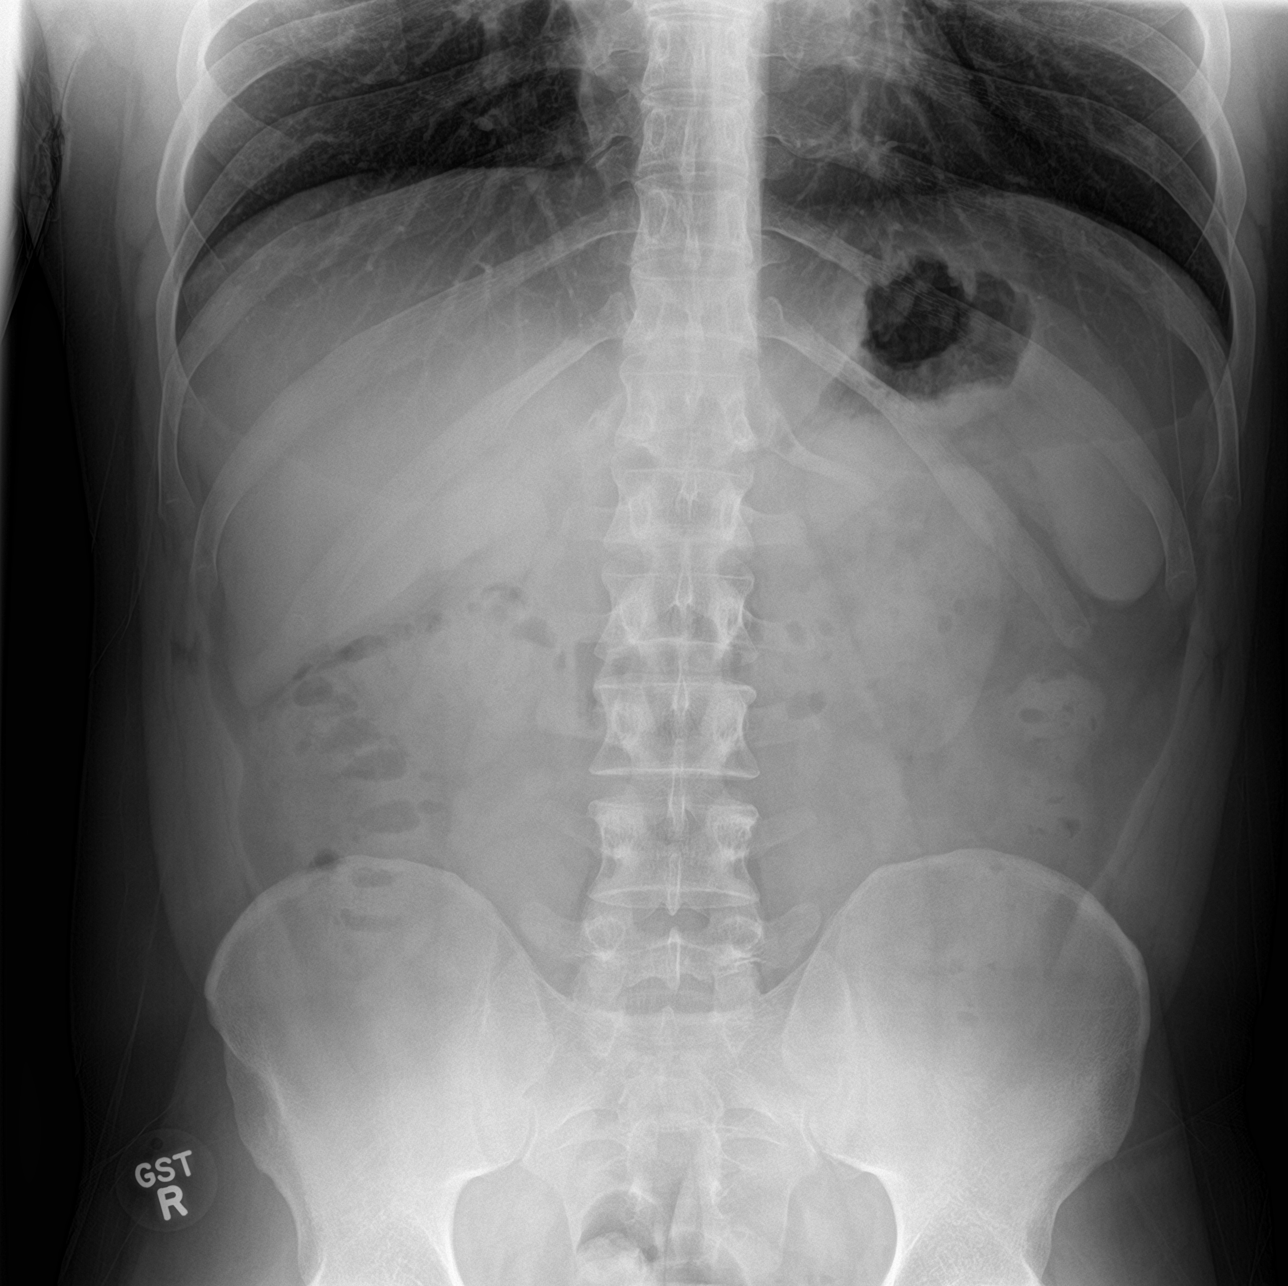

[abdomen supine]
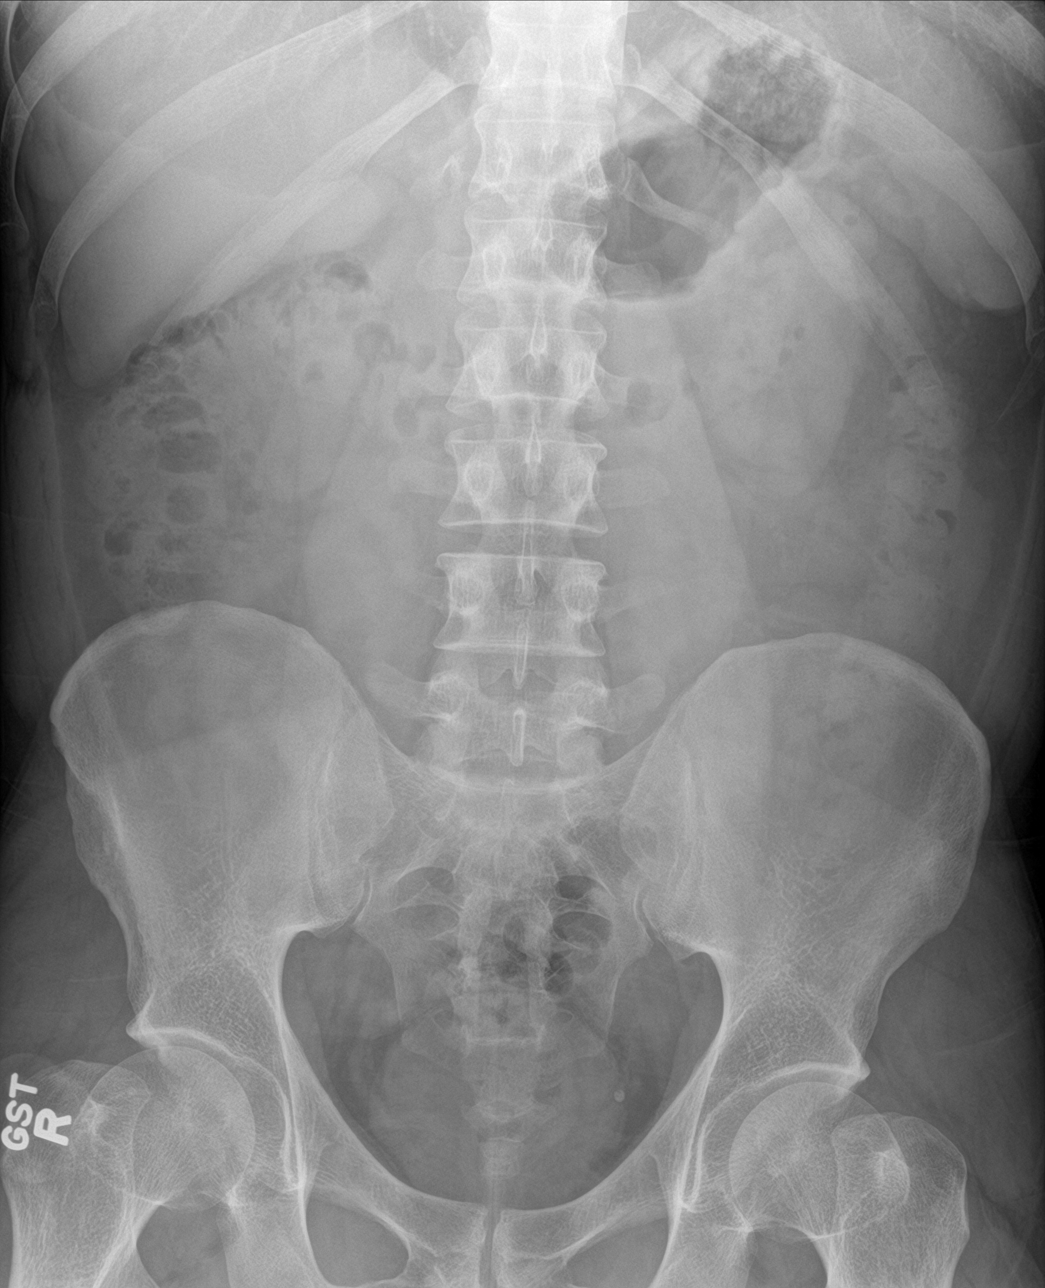

[3 of 3 positions shown; findings below may reference images not displayed]

FINDINGS: The lungs are clear. There is no pleural effusion or pneumothorax.
The cardiac silhouette is within normal limits.

There is no bowel dilatation or evidence of obstruction. No free air
or radiopaque calculi. The osseous structures and soft tissues
appear unremarkable.
IMPRESSION: Negative abdominal radiographs.  No acute cardiopulmonary disease.

## 2019-09-07 ENCOUNTER — Telehealth: Payer: Self-pay

## 2019-09-07 ENCOUNTER — Encounter: Payer: Self-pay | Admitting: Family Medicine

## 2019-09-07 NOTE — Telephone Encounter (Signed)
Dr. Cathie Hoops would like for pt to be set up for lab/MD/ poss phlebotomy. Contacted patient and notified him that his hemoglobin level was increasing and he would benefit from phlebotomy. Also that she discussed concern with his PCP. Pt stated that he would not be able to come to an appt for a few weeks. I offered to set up appts now and if they didn't work out he could call back and reschedule, but he insisted that we give him a call in 3 weeks.

## 2019-09-27 NOTE — Telephone Encounter (Signed)
Contacted patient to inquire about appt, but he states he is not interested in following up.

## 2021-06-24 IMAGING — US US ABDOMEN LIMITED
1 series · 14 of 25 positions shown · non-contrast
Comparison: None.

CLINICAL DATA: Right upper quadrant pain

EXAM:
ULTRASOUND ABDOMEN LIMITED RIGHT UPPER QUADRANT

[Series 1: us abdomen limited · 0.14mm/px · 14 of 39 slices shown]
[im 1/39]
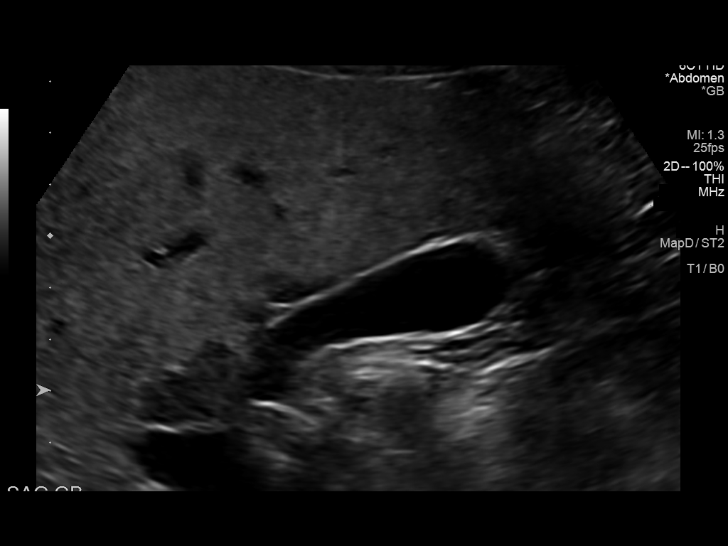
[im 4/39]
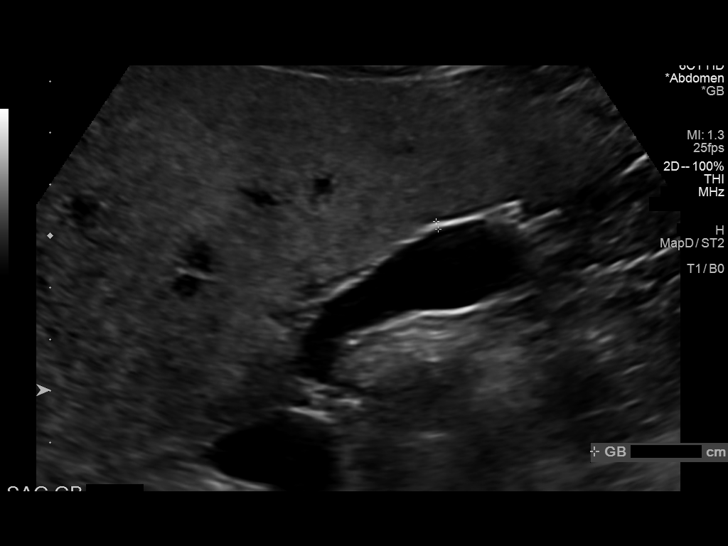
[im 7/39]
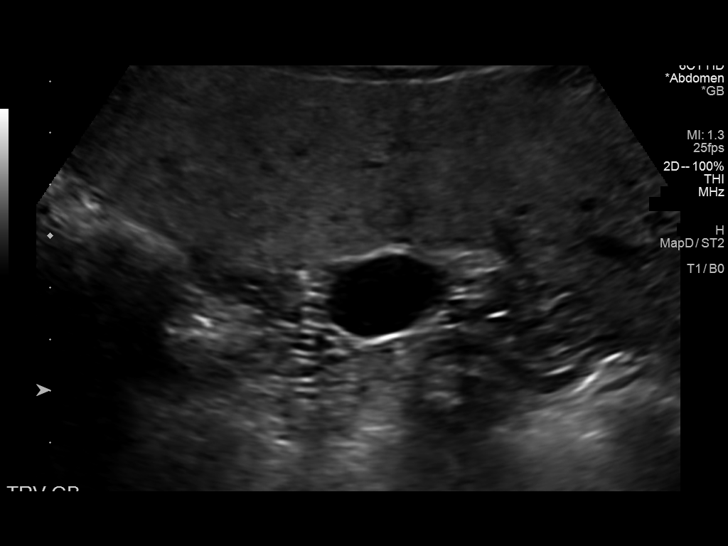
[im 10/39]
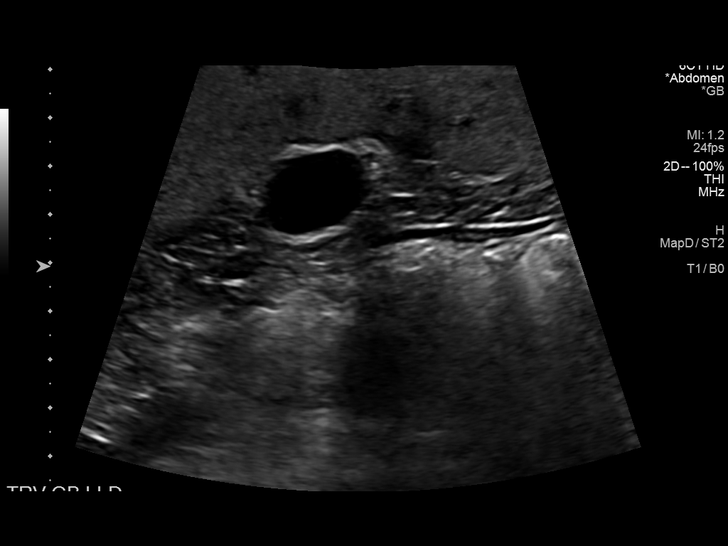
[im 13/39]
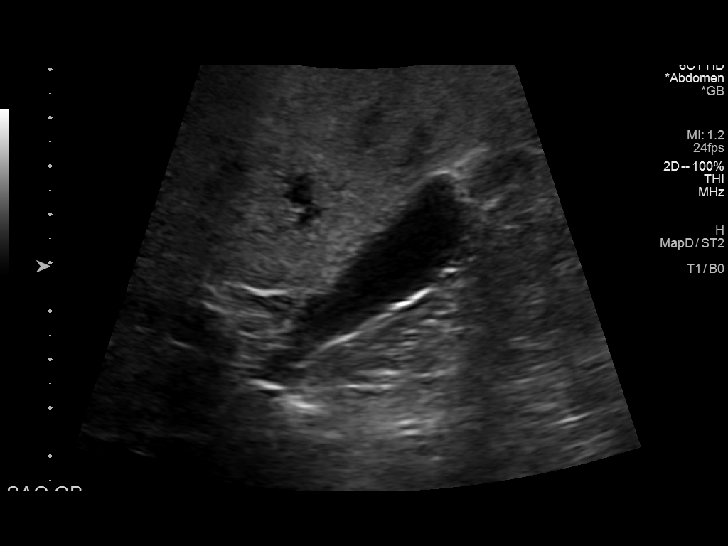
[im 15/39]
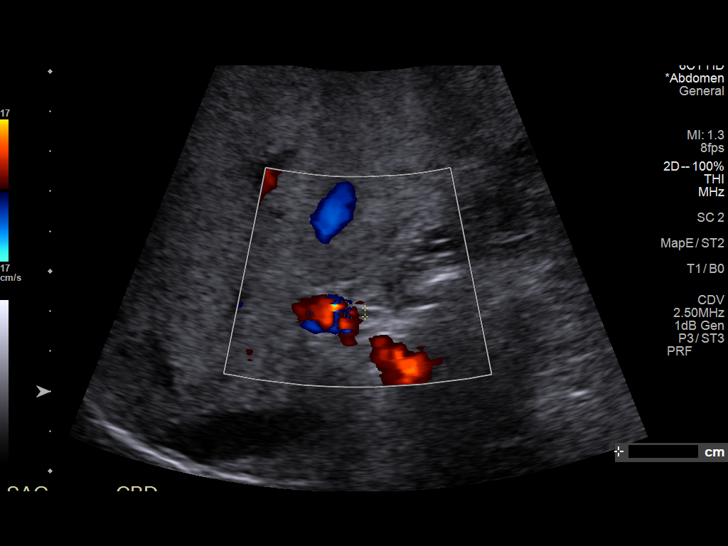
[im 18/39]
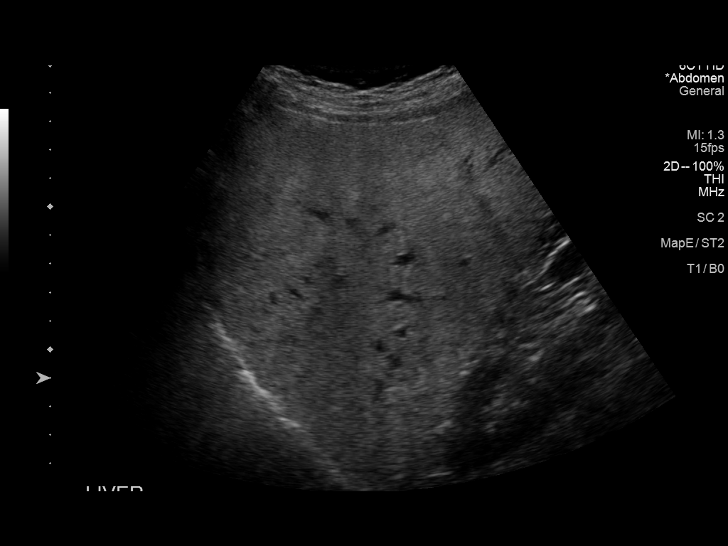
[im 21/39]
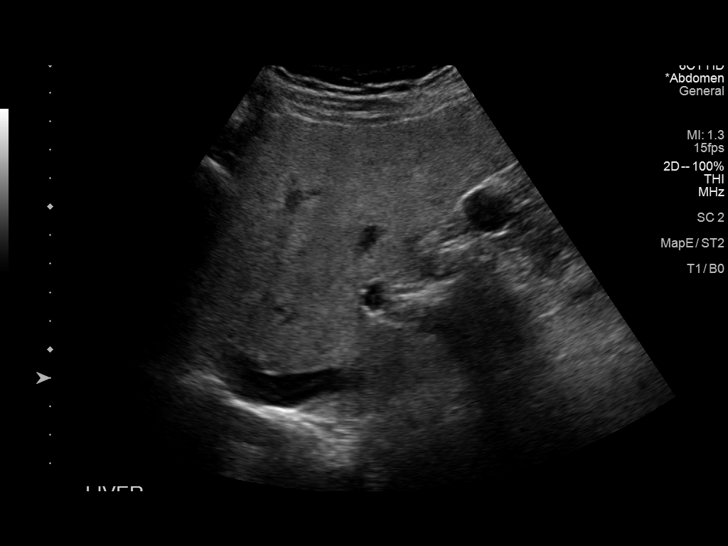
[im 24/39]
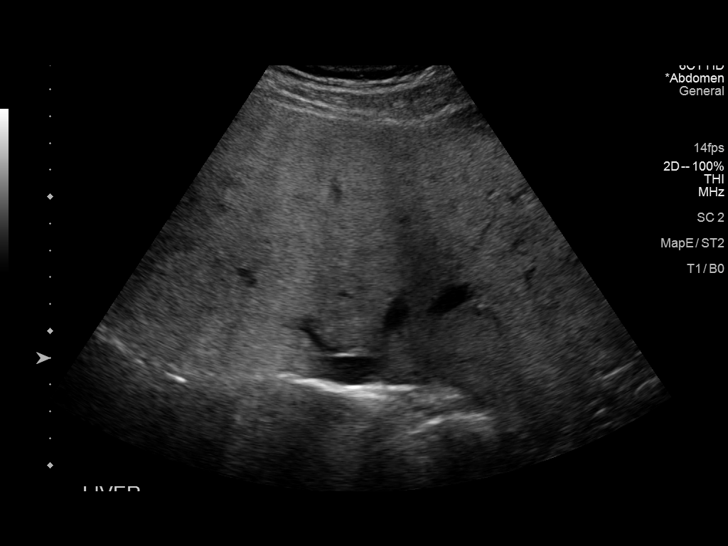
[im 26/39]
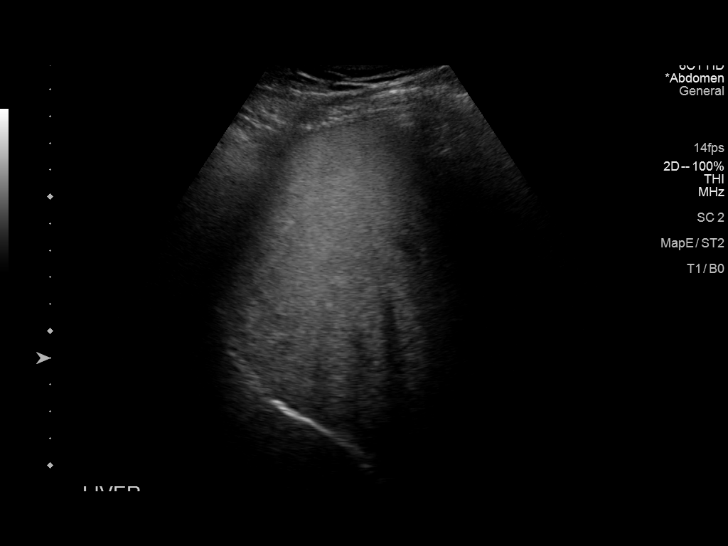
[im 29/39]
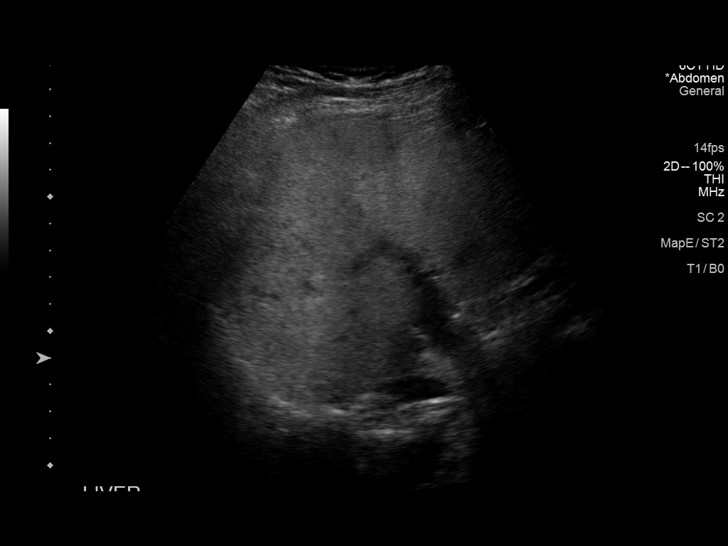
[im 32/39]
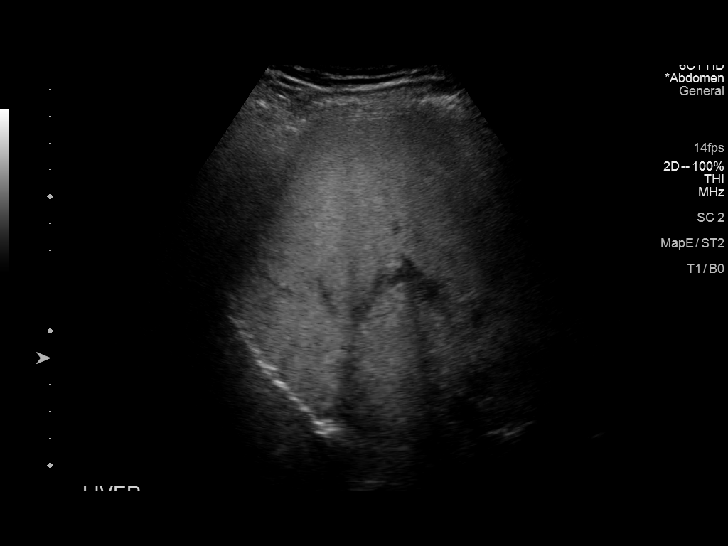
[im 35/39]
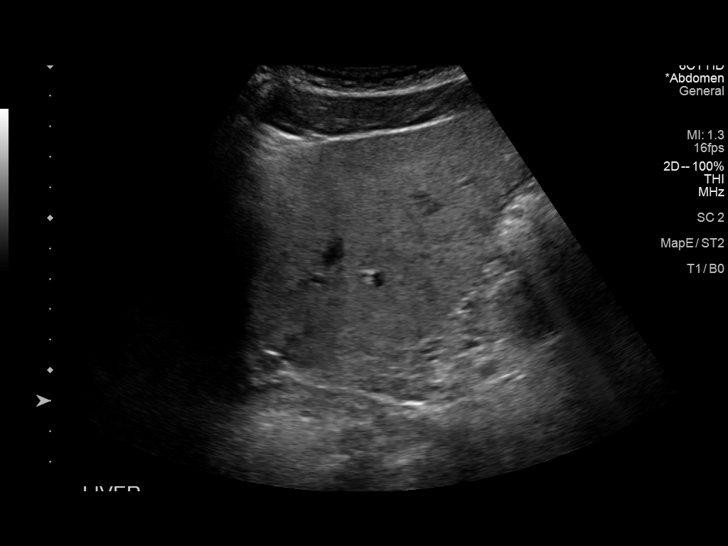
[im 39/39]
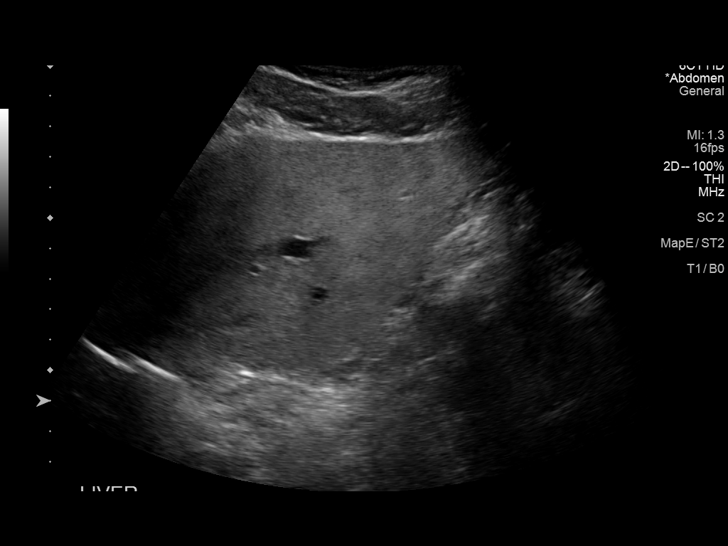

[14 of 25 positions shown; findings below may reference images not displayed]

FINDINGS: Gallbladder:

No gallstones or wall thickening visualized. There is no
pericholecystic fluid. No sonographic Murphy sign noted by
sonographer.

Common bile duct:

Diameter: 2 mm. No intrahepatic or extrahepatic biliary duct
dilatation.

Liver:

No focal lesion identified. Liver echogenicity overall is increased.
Portal vein is patent on color Doppler imaging with normal direction
of blood flow towards the liver.

Other: None.
IMPRESSION: Increased liver echogenicity, a finding indicative of hepatic
steatosis. No focal liver lesions evident.

Study otherwise unremarkable.

## 2022-02-04 ENCOUNTER — Encounter: Payer: Self-pay | Admitting: *Deleted

## 2022-04-25 ENCOUNTER — Encounter: Payer: Self-pay | Admitting: *Deleted

## 2022-07-23 ENCOUNTER — Ambulatory Visit (INDEPENDENT_AMBULATORY_CARE_PROVIDER_SITE_OTHER): Payer: BC Managed Care – PPO | Admitting: Family

## 2022-07-23 ENCOUNTER — Encounter: Payer: Self-pay | Admitting: Family

## 2022-07-23 VITALS — BP 138/93 | HR 106 | Temp 97.8°F | Ht 67.5 in | Wt 172.1 lb

## 2022-07-23 DIAGNOSIS — J329 Chronic sinusitis, unspecified: Secondary | ICD-10-CM | POA: Diagnosis not present

## 2022-07-23 DIAGNOSIS — B9789 Other viral agents as the cause of diseases classified elsewhere: Secondary | ICD-10-CM | POA: Diagnosis not present

## 2022-07-23 MED ORDER — TRIAMCINOLONE ACETONIDE 55 MCG/ACT NA AERO: 1.0000 | INHALATION_SPRAY | Freq: Every day | NASAL | 2 refills | Status: AC

## 2022-07-23 NOTE — Progress Notes (Signed)
Patient ID: Derrick Hammers., male    DOB: 05/23/1990, 32 y.o.   MRN: [id number]  Chief Complaint  Patient presents with   Sinus Problem    Pt c/o head and facial pressure, green/yellow mucus with cough. Has tried ibuprofen and mucinex which did not help. Covid tested yesterday negative. Present for about a week.     HPI:      Sinus sx:  Pt c/o head and facial pressure, green/yellow mucus with cough. Has tried ibuprofen and mucinex for cough/congestion which did not help. Covid tested yesterday negative. Present for about a week, but states today he feels a little better.  Assessment & Plan:  1. Viral sinusitis - sending Nasacort, advised on use & SE.Use saline nasal spray or Neti pot tid & prior to steroid spray, drink plenty of water. Continue taking Ibuprofen up to '600mg'$  tid prn for headache & sinus pressure.  - triamcinolone (NASACORT) 55 MCG/ACT AERO nasal inhaler; Place 1 spray into the nose daily. Start with 1 spray each side twice a day for 3 days, then reduce to daily.  Dispense: 1 each; Refill: 2   Subjective:    Outpatient Medications Prior to Visit  Medication Sig Dispense Refill   albuterol (VENTOLIN HFA) 108 (90 Base) MCG/ACT inhaler Inhale 2 puffs into the lungs every 6 (six) hours as needed for wheezing or shortness of breath. 18 g 1   esomeprazole (NEXIUM) 20 MG packet Take 20 mg by mouth daily before breakfast.     No facility-administered medications prior to visit.   Past Medical History:  Diagnosis Date   Asthma    childhood- declines need for albuterol as adult   GERD (gastroesophageal reflux disease)    intermittent prn OTC   Heart murmur    in childhood only   Past Surgical History:  Procedure Laterality Date   MOUTH SURGERY     TONSILLECTOMY AND ADENOIDECTOMY Bilateral    Allergies  Allergen Reactions   Sulfa Antibiotics Anaphylaxis   Codone [Hydrocodone] Rash    Mouth breaks out in sores (if taken)   Percocet [Oxycodone-Acetaminophen] Rash     Mouth will break out in sores (if taken)      Objective:    Physical Exam Vitals and nursing note reviewed.  Constitutional:      General: He is not in acute distress.    Appearance: Normal appearance.  HENT:     Head: Normocephalic.     Right Ear: Tympanic membrane and ear canal normal.     Left Ear: Tympanic membrane and ear canal normal.     Nose:     Right Sinus: Frontal sinus tenderness (feels pressure) present. No maxillary sinus tenderness.     Left Sinus: Frontal sinus tenderness present. No maxillary sinus tenderness.     Mouth/Throat:     Mouth: Mucous membranes are moist.     Pharynx: No pharyngeal swelling, oropharyngeal exudate, posterior oropharyngeal erythema or uvula swelling.     Tonsils: No tonsillar exudate or tonsillar abscesses.  Cardiovascular:     Rate and Rhythm: Normal rate and regular rhythm.  Pulmonary:     Effort: Pulmonary effort is normal.     Breath sounds: Normal breath sounds.  Musculoskeletal:        General: Normal range of motion.     Cervical back: Normal range of motion.  Lymphadenopathy:     Head:     Right side of head: No preauricular or posterior auricular adenopathy.  Left side of head: No preauricular or posterior auricular adenopathy.     Cervical: No cervical adenopathy.  Skin:    General: Skin is warm and dry.  Neurological:     Mental Status: He is alert and oriented to person, place, and time.  Psychiatric:        Mood and Affect: Mood normal.    BP (!) 138/93 (BP Location: Left Arm, Patient Position: Sitting, Cuff Size: Large)   Pulse (!) 106   Temp 97.8 F (36.6 C) (Temporal)   Ht 5' 7.5" (1.715 m)   Wt 172 lb 2 oz (78.1 kg)   SpO2 97%   BMI 26.56 kg/m  Wt Readings from Last 3 Encounters:  07/23/22 172 lb 2 oz (78.1 kg)  04/30/19 183 lb (83 kg)  11/02/18 183 lb 12.8 oz (83.4 kg)       Jeanie Sewer, NP

## 2024-02-02 ENCOUNTER — Telehealth: Payer: Self-pay | Admitting: Family Medicine

## 2024-02-02 NOTE — Telephone Encounter (Signed)
 Likely does not need a referral-I would have him call an ophthalmologist ASAP especially just since in one eye-if they need a referral we are happy to place 1 or you can go ahead and place 1 if he knows 1 but I would encourage him to do this very quickly

## 2024-02-02 NOTE — Telephone Encounter (Unsigned)
 Copied from CRM [id number]. Topic: Referral - Question >> Feb 02, 2024  9:03 AM Mesmerise C wrote: Reason for CRM: Patient stated there's been blurriness in his left eye doesn't recall when it started happening inquiring if needs a referral for a eye doctor to have eyes looked at, states if he squints it's clear would like a call back

## 2024-02-03 NOTE — Telephone Encounter (Signed)
 Called and spoke to patient about how he's doing with Left eye blurriness. Patient says better today but still a little blurry, itchy and watery. Spoke with him about Dr San notes and recommendation of finding an Ophthalmologist. Pt acknowledged understanding.
